# Patient Record
Sex: Male | Born: 1955
Health system: Southern US, Community
[De-identification: ages and names within clinical notes are randomized; demographics above are authoritative.]

## PROBLEM LIST (undated history)

## (undated) DIAGNOSIS — M19049 Primary osteoarthritis, unspecified hand: Secondary | ICD-10-CM

## (undated) DIAGNOSIS — R7302 Impaired glucose tolerance (oral): Secondary | ICD-10-CM

## (undated) DIAGNOSIS — E119 Type 2 diabetes mellitus without complications: Secondary | ICD-10-CM

## (undated) DIAGNOSIS — I10 Essential (primary) hypertension: Secondary | ICD-10-CM

## (undated) DIAGNOSIS — R7303 Prediabetes: Secondary | ICD-10-CM

## (undated) DIAGNOSIS — K219 Gastro-esophageal reflux disease without esophagitis: Secondary | ICD-10-CM

## (undated) DIAGNOSIS — K5900 Constipation, unspecified: Secondary | ICD-10-CM

## (undated) DIAGNOSIS — T7840XA Allergy, unspecified, initial encounter: Secondary | ICD-10-CM

## (undated) DIAGNOSIS — E785 Hyperlipidemia, unspecified: Secondary | ICD-10-CM

## (undated) DIAGNOSIS — J309 Allergic rhinitis, unspecified: Secondary | ICD-10-CM

## (undated) HISTORY — PX: CYST EXCISION: SHX5701

## (undated) HISTORY — DX: Constipation, unspecified: K59.00

## (undated) HISTORY — DX: Allergic rhinitis, unspecified: J30.9

## (undated) HISTORY — PX: COLONOSCOPY: SHX174

## (undated) HISTORY — PX: EYE SURGERY: SHX253

## (undated) HISTORY — DX: Primary osteoarthritis, unspecified hand: M19.049

## (undated) HISTORY — DX: Impaired glucose tolerance (oral): R73.02

## (undated) HISTORY — DX: Hyperlipidemia, unspecified: E78.5

## (undated) HISTORY — DX: Gastro-esophageal reflux disease without esophagitis: K21.9

## (undated) HISTORY — DX: Allergy, unspecified, initial encounter: T78.40XA

## (undated) HISTORY — PX: POLYPECTOMY: SHX149

## (undated) HISTORY — PX: CATARACT EXTRACTION, BILATERAL: SHX1313

---

## 1993-03-24 HISTORY — PX: INGUINAL HERNIA REPAIR: SUR1180

## 2000-05-02 ENCOUNTER — Emergency Department (HOSPITAL_COMMUNITY): Admission: EM | Admit: 2000-05-02 | Discharge: 2000-05-02 | Payer: Self-pay | Admitting: Emergency Medicine

## 2000-05-03 ENCOUNTER — Encounter: Payer: Self-pay | Admitting: Emergency Medicine

## 2000-05-07 ENCOUNTER — Emergency Department (HOSPITAL_COMMUNITY): Admission: EM | Admit: 2000-05-07 | Discharge: 2000-05-07 | Payer: Self-pay | Admitting: Emergency Medicine

## 2000-07-08 ENCOUNTER — Encounter: Admission: RE | Admit: 2000-07-08 | Discharge: 2000-07-08 | Payer: Self-pay | Admitting: Internal Medicine

## 2000-07-08 ENCOUNTER — Encounter: Payer: Self-pay | Admitting: Internal Medicine

## 2000-07-10 ENCOUNTER — Encounter: Payer: Self-pay | Admitting: Internal Medicine

## 2000-07-10 ENCOUNTER — Encounter: Admission: RE | Admit: 2000-07-10 | Discharge: 2000-07-10 | Payer: Self-pay | Admitting: Internal Medicine

## 2000-07-20 ENCOUNTER — Ambulatory Visit (HOSPITAL_COMMUNITY): Admission: RE | Admit: 2000-07-20 | Discharge: 2000-07-20 | Payer: Self-pay | Admitting: Gastroenterology

## 2002-05-27 ENCOUNTER — Encounter: Admission: RE | Admit: 2002-05-27 | Discharge: 2002-05-27 | Payer: Self-pay | Admitting: Internal Medicine

## 2002-05-27 ENCOUNTER — Encounter: Payer: Self-pay | Admitting: Internal Medicine

## 2004-06-17 ENCOUNTER — Ambulatory Visit: Payer: Self-pay | Admitting: Internal Medicine

## 2005-12-05 ENCOUNTER — Ambulatory Visit: Payer: Self-pay | Admitting: Internal Medicine

## 2006-02-05 ENCOUNTER — Ambulatory Visit: Payer: Self-pay | Admitting: Internal Medicine

## 2006-10-19 ENCOUNTER — Ambulatory Visit: Payer: Self-pay | Admitting: Internal Medicine

## 2006-10-19 LAB — CONVERTED CEMR LAB
ALT: 44 units/L (ref 0–53)
AST: 29 units/L (ref 0–37)
Albumin: 4 g/dL (ref 3.5–5.2)
Alkaline Phosphatase: 36 units/L — ABNORMAL LOW (ref 39–117)
BUN: 10 mg/dL (ref 6–23)
Basophils Absolute: 0 10*3/uL (ref 0.0–0.1)
Basophils Relative: 0.5 % (ref 0.0–1.0)
Bilirubin Urine: NEGATIVE
Bilirubin, Direct: 0.1 mg/dL (ref 0.0–0.3)
CO2: 33 meq/L — ABNORMAL HIGH (ref 19–32)
Calcium: 9.1 mg/dL (ref 8.4–10.5)
Chloride: 105 meq/L (ref 96–112)
Cholesterol: 222 mg/dL (ref 0–200)
Creatinine, Ser: 1.2 mg/dL (ref 0.4–1.5)
Direct LDL: 144.7 mg/dL
Eosinophils Absolute: 0.1 10*3/uL (ref 0.0–0.6)
Eosinophils Relative: 2 % (ref 0.0–5.0)
GFR calc Af Amer: 81 mL/min
GFR calc non Af Amer: 67 mL/min
Glucose, Bld: 112 mg/dL — ABNORMAL HIGH (ref 70–99)
HCT: 41.6 % (ref 39.0–52.0)
HDL: 33.8 mg/dL — ABNORMAL LOW (ref >39.0)
Hemoglobin, Urine: NEGATIVE
Hemoglobin: 14.1 g/dL (ref 13.0–17.0)
Ketones, ur: NEGATIVE mg/dL
Leukocytes, UA: NEGATIVE
Lymphocytes Relative: 36.8 % (ref 12.0–46.0)
MCHC: 33.9 g/dL (ref 30.0–36.0)
MCV: 89.5 fL (ref 78.0–100.0)
Monocytes Absolute: 0.4 10*3/uL (ref 0.2–0.7)
Monocytes Relative: 7 % (ref 3.0–11.0)
Neutro Abs: 3.3 10*3/uL (ref 1.4–7.7)
Neutrophils Relative %: 53.7 % (ref 43.0–77.0)
Nitrite: NEGATIVE
Platelets: 211 10*3/uL (ref 150–400)
Potassium: 4.2 meq/L (ref 3.5–5.1)
RBC: 4.65 M/uL (ref 4.22–5.81)
RDW: 12.6 % (ref 11.5–14.6)
Sodium: 144 meq/L (ref 135–145)
Specific Gravity, Urine: 1.025 (ref 1.000–1.03)
TSH: 1.43 microintl units/mL (ref 0.35–5.50)
Total Bilirubin: 1.6 mg/dL — ABNORMAL HIGH (ref 0.3–1.2)
Total CHOL/HDL Ratio: 6.6
Total Protein, Urine: NEGATIVE mg/dL
Total Protein: 6.8 g/dL (ref 6.0–8.3)
Triglycerides: 135 mg/dL (ref 0–149)
Urine Glucose: NEGATIVE mg/dL
Urobilinogen, UA: 0.2 (ref 0.0–1.0)
VLDL: 27 mg/dL (ref 0–40)
WBC: 6 10*3/uL (ref 4.5–10.5)
pH: 6.5 (ref 5.0–8.0)

## 2006-10-21 DIAGNOSIS — Z87898 Personal history of other specified conditions: Secondary | ICD-10-CM | POA: Insufficient documentation

## 2006-10-21 DIAGNOSIS — A630 Anogenital (venereal) warts: Secondary | ICD-10-CM | POA: Insufficient documentation

## 2006-10-21 DIAGNOSIS — J309 Allergic rhinitis, unspecified: Secondary | ICD-10-CM | POA: Insufficient documentation

## 2006-11-30 ENCOUNTER — Encounter: Payer: Self-pay | Admitting: Internal Medicine

## 2007-04-21 ENCOUNTER — Encounter: Payer: Self-pay | Admitting: Internal Medicine

## 2007-09-13 ENCOUNTER — Ambulatory Visit: Payer: Self-pay | Admitting: Internal Medicine

## 2007-09-13 DIAGNOSIS — E785 Hyperlipidemia, unspecified: Secondary | ICD-10-CM | POA: Insufficient documentation

## 2007-09-13 DIAGNOSIS — E739 Lactose intolerance, unspecified: Secondary | ICD-10-CM | POA: Insufficient documentation

## 2007-09-15 ENCOUNTER — Telehealth (INDEPENDENT_AMBULATORY_CARE_PROVIDER_SITE_OTHER): Payer: Self-pay | Admitting: *Deleted

## 2007-09-23 ENCOUNTER — Encounter: Payer: Self-pay | Admitting: Internal Medicine

## 2007-09-27 ENCOUNTER — Telehealth (INDEPENDENT_AMBULATORY_CARE_PROVIDER_SITE_OTHER): Payer: Self-pay | Admitting: *Deleted

## 2008-03-05 ENCOUNTER — Emergency Department (HOSPITAL_COMMUNITY): Admission: EM | Admit: 2008-03-05 | Discharge: 2008-03-05 | Payer: Self-pay | Admitting: Emergency Medicine

## 2009-01-03 ENCOUNTER — Encounter: Payer: Self-pay | Admitting: Internal Medicine

## 2009-05-11 ENCOUNTER — Ambulatory Visit: Payer: Self-pay | Admitting: Internal Medicine

## 2009-05-11 DIAGNOSIS — H1045 Other chronic allergic conjunctivitis: Secondary | ICD-10-CM | POA: Insufficient documentation

## 2009-07-02 ENCOUNTER — Encounter: Payer: Self-pay | Admitting: Internal Medicine

## 2010-03-14 ENCOUNTER — Telehealth: Payer: Self-pay | Admitting: Internal Medicine

## 2010-03-19 ENCOUNTER — Telehealth: Payer: Self-pay | Admitting: Internal Medicine

## 2010-03-19 ENCOUNTER — Encounter (INDEPENDENT_AMBULATORY_CARE_PROVIDER_SITE_OTHER): Payer: Self-pay | Admitting: *Deleted

## 2010-04-21 LAB — CONVERTED CEMR LAB
AST: 42 units/L — ABNORMAL HIGH (ref 0–37)
AST: 46 units/L — ABNORMAL HIGH (ref 0–37)
Albumin: 4.6 g/dL (ref 3.5–5.2)
Alkaline Phosphatase: 39 units/L (ref 39–117)
Basophils Absolute: 0 10*3/uL (ref 0.0–0.1)
Basophils Relative: 0.5 % (ref 0.0–1.0)
Basophils Relative: 0.6 % (ref 0.0–3.0)
Bilirubin Urine: NEGATIVE
Bilirubin, Direct: 0.2 mg/dL (ref 0.0–0.3)
Calcium: 9.1 mg/dL (ref 8.4–10.5)
Calcium: 9.6 mg/dL (ref 8.4–10.5)
Chloride: 102 meq/L (ref 96–112)
Cholesterol: 250 mg/dL (ref 0–200)
Creatinine, Ser: 1.1 mg/dL (ref 0.4–1.5)
Direct LDL: 171.6 mg/dL
Eosinophils Absolute: 0.1 10*3/uL (ref 0.0–0.7)
GFR calc Af Amer: 90 mL/min
GFR calc non Af Amer: 74 mL/min
GFR calc non Af Amer: 80.59 mL/min (ref >60)
HDL: 43.8 mg/dL (ref >39.0)
Hemoglobin, Urine: NEGATIVE
Hemoglobin: 14.2 g/dL (ref 13.0–17.0)
Hgb A1c MFr Bld: 6.2 % — ABNORMAL HIGH (ref 4.6–6.0)
Ketones, ur: NEGATIVE mg/dL
Ketones, ur: NEGATIVE mg/dL
Leukocytes, UA: NEGATIVE
Lymphocytes Relative: 28.5 % (ref 12.0–46.0)
MCHC: 34.9 g/dL (ref 30.0–36.0)
MCV: 88.2 fL (ref 78.0–100.0)
Monocytes Relative: 7.2 % (ref 3.0–12.0)
Neutro Abs: 5 10*3/uL (ref 1.4–7.7)
Neutrophils Relative %: 50.2 % (ref 43.0–77.0)
Neutrophils Relative %: 62.7 % (ref 43.0–77.0)
Nitrite: NEGATIVE
PSA: 0.33 ng/mL (ref 0.10–4.00)
PSA: 0.41 ng/mL (ref 0.10–4.00)
Platelets: 193 10*3/uL (ref 150–400)
RBC: 4.63 M/uL (ref 4.22–5.81)
RBC: 4.91 M/uL (ref 4.22–5.81)
Sodium: 144 meq/L (ref 135–145)
Specific Gravity, Urine: 1.015 (ref 1.000–1.030)
TSH: 1.4 microintl units/mL (ref 0.35–5.50)
TSH: 1.64 microintl units/mL (ref 0.35–5.50)
Total Bilirubin: 1.6 mg/dL — ABNORMAL HIGH (ref 0.3–1.2)
Total Protein: 7.8 g/dL (ref 6.0–8.3)
Triglycerides: 144 mg/dL (ref 0–149)
Urine Glucose: NEGATIVE mg/dL
Urobilinogen, UA: 0.2 (ref 0.0–1.0)
Urobilinogen, UA: 0.2 (ref 0.0–1.0)
VLDL: 20.2 mg/dL (ref 0.0–40.0)
VLDL: 29 mg/dL (ref 0–40)
WBC: 8 10*3/uL (ref 4.5–10.5)
pH: 7.5 (ref 5.0–8.0)

## 2010-04-23 NOTE — Assessment & Plan Note (Signed)
Summary: CPX / NWS  #   Vital Signs:  Patient profile:   55 year old male Height:      70 inches Weight:      192.25 pounds BMI:     27.68 O2 Sat:      97 % on Room air Temp:     98.3 degrees F oral Pulse rate:   79 / minute BP sitting:   138 / 90  (left arm)  Vitals Entered By: Lucious Groves (May 11, 2009 11:15 AM)  O2 Flow:  Room air  CC: CPX--Oc feels weak or faint, but denies passing out./kb Is Patient Diabetic? No Pain Assessment Patient in pain? no        CC:  CPX--Oc feels weak or faint and but denies passing out./kb.  History of Present Illness: has physical job, looking for new job b/c has to travel as well; has vague episodes of general weakness once monthly it seems but not incapacitating, mostly annoying but keeps happening;  Pt denies CP, sob, doe, wheezing, orthopnea, pnd, worsening LE edema, palps, dizziness or syncope   Pt denies new neuro symptoms such as headache, facial or extremity weakness   No fever, wt loss, rash, joint swelling, increased stress or depressive symptoms, or falls or trauma.    Problems Prior to Update: 1)  Preventive Health Care  (ICD-V70.0) 2)  Conjunctivitis, Allergic  (ICD-372.14) 3)  Glucose Intolerance  (ICD-271.3) 4)  Hyperlipidemia  (ICD-272.4) 5)  Preventive Health Care  (ICD-V70.0) 6)  Venereal Wart  (ICD-078.11) 7)  Genital Herpes, Hx of  (ICD-V13.8) 8)  Allergic Rhinitis  (ICD-477.9)  Medications Prior to Update: 1)  Prevacid 30 Mg  Cpdr (Lansoprazole) .Marland Kitchen.. 1 Po Once Daily 2)  Adult Aspirin Ec Low Strength 81 Mg  Tbec (Aspirin) .Marland Kitchen.. 1 By Mouth Once Daily 3)  Cetirizine Hcl 10 Mg  Tabs (Cetirizine Hcl) .Marland Kitchen.. 1 By Mouth Once Daily As Needed Allergies 4)  Simvastatin 40 Mg  Tabs (Simvastatin) .... Take 1 Tablet By Mouth Once A Day 5)  Valtrex 1 Gm  Tabs (Valacyclovir Hcl) .... Take 1 Tablet By Mouth Once A Day  Current Medications (verified): 1)  Prevacid 30 Mg  Cpdr (Lansoprazole) .Marland Kitchen.. 1 Po Once Daily - Generic 2)   Adult Aspirin Ec Low Strength 81 Mg  Tbec (Aspirin) .Marland Kitchen.. 1 By Mouth Once Daily 3)  Fexofenadine Hcl 180 Mg Tabs (Fexofenadine Hcl) .Marland Kitchen.. 1po Once Daily 4)  Simvastatin 40 Mg  Tabs (Simvastatin) .... Take 1 Tablet By Mouth Once A Day 5)  Valtrex 1 Gm  Tabs (Valacyclovir Hcl) .... 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  all otherwise negative per pt -  does have some eye allergy symptoms worse in the past 2 to 3 months with itching and clear d/c  Physical Exam  General:  alert and overweight-appearing.   Head:  normocephalic and atraumatic.   Eyes:  vision grossly intact, pupils equal, and pupils round.   Ears:  R ear normal and L ear normal.   Nose:  no external deformity and no nasal discharge.   Mouth:  no gingival abnormalities and pharynx pink and moist.   Neck:  supple and no masses.   Lungs:  normal  respiratory effort and normal breath sounds.   Heart:  normal rate and regular rhythm.   Abdomen:  soft, non-tender, and normal bowel sounds.   Msk:  no joint tenderness and no joint swelling.   Extremities:  no edema, no erythema  Neurologic:  cranial nerves II-XII intact and strength normal in all extremities.   Skin:  color normal and no rashes.   Psych:  not anxious appearing and not depressed appearing.     Impression & Recommendations:  Problem # 1:  Preventive Health Care (ICD-V70.0) Overall doing well, age appropriate education and counseling updated and referral for appropriate preventive services done unless declined, immunizations up to date or declined, diet counseling done if overweight, urged to quit smoking if smokes , most recent labs reviewed and current ordered if appropriate, ecg reviewed or declined (interpretation per ECG scanned in the EMR if done); information regarding Medicare Prevention requirements given if appropriate  Orders: EKG w/ Interpretation (93000) Gastroenterology Referral (GI) TLB-BMP (Basic Metabolic Panel-BMET) (80048-METABOL) TLB-CBC Platelet - w/Differential (85025-CBCD) TLB-Hepatic/Liver Function Pnl (80076-HEPATIC) TLB-Lipid Panel (80061-LIPID) TLB-PSA (Prostate Specific Antigen) (84153-PSA) TLB-TSH (Thyroid Stimulating Hormone) (84443-TSH) TLB-Udip ONLY (81003-UDIP)  Problem # 2:  CONJUNCTIVITIS, ALLERGIC (ICD-372.14) allegra, zaditor  Complete Medication List: 1)  Prevacid 30 Mg Cpdr (Lansoprazole) .Marland Kitchen.. 1 po once daily - generic 2)  Adult Aspirin Ec Low Strength 81 Mg Tbec (Aspirin) .Marland Kitchen.. 1 by mouth once daily 3)  Fexofenadine Hcl 180 Mg Tabs (Fexofenadine hcl) .Marland Kitchen.. 1po once daily 4)  Simvastatin 40 Mg Tabs (Simvastatin) .... Take 1 tablet by mouth once a day 5)  Valtrex 1 Gm Tabs (Valacyclovir hcl) .... Take 1 tablet by mouth once a day  Other Orders: Tdap => 92yrs IM ([address]) Admin 1st Vaccine (60454)  Patient Instructions: 1)  you  had the tetanus shot today 2)  Please take all new medications as prescribed - the generic for allegra for the allergies 3)  Please also try the OTC Zaditor (eye drops for allergies) 4)  Please go to the Lab in the basement for your blood and/or urine tests today 5)  You will be contacted about the referral(s) to: colonoscopy for 6 months 6)  you are given the refills today 7)  Please schedule a follow-up appointment in 1 year or sooner if needed Prescriptions: VALTREX 1 GM  TABS (VALACYCLOVIR HCL) Take 1 tablet by mouth once a day  #30 x 11   Entered and Authorized by:   Corwin Levins MD   Signed by:   Corwin Levins MD on 05/11/2009   Method used:   Print then Give to Patient   RxID:   [id number] SIMVASTATIN 40 MG  TABS (SIMVASTATIN) Take 1 tablet by mouth once a day  #90 x 3   Entered and Authorized by:   Corwin Levins MD   Signed by:   Corwin Levins MD on 05/11/2009  Method used:   Print then Give to Patient   RxID:   [id number] FEXOFENADINE HCL 180 MG TABS (FEXOFENADINE HCL) 1po once daily  #90 x 3   Entered and Authorized by:   Corwin Levins MD   Signed by:   Corwin Levins MD on 05/11/2009   Method used:   Print then Give to Patient   RxID:   [id number] PREVACID 30 MG  CPDR (LANSOPRAZOLE) 1 po once daily - generic  #90 x 3   Entered and Authorized by:   Corwin Levins MD   Signed by:   Corwin Levins MD on 05/11/2009   Method used:   Print then Give to Patient   RxID:   [id number]    Prevention & Chronic Care Immunizations   Influenza vaccine: Fluvax 3+  (02/05/2006)    Tetanus booster: 05/11/2009: Tdap    Pneumococcal vaccine: Not documented  Colorectal Screening   Hemoccult: Not documented    Colonoscopy: Not documented  Other Screening   PSA: 0.33  (09/13/2007)   PSA ordered.   Smoking status: never  (09/13/2007)  Lipids   Total Cholesterol: 250  (09/13/2007)   LDL: DEL  (09/13/2007)   LDL Direct: 171.6  (09/13/2007)   HDL: 43.8   (09/13/2007)   Triglycerides: 144  (09/13/2007)    SGOT (AST): 46  (09/13/2007)   SGPT (ALT): 75  (09/13/2007)   Alkaline phosphatase: 51  (09/13/2007)   Total bilirubin: 1.6  (09/13/2007)  Self-Management Support :    Lipid self-management support: Not documented     Immunizations Administered:  Tetanus Vaccine:    Vaccine Type: Tdap    Site: left deltoid    Mfr: GlaxoSmithKline    Dose: 0.5 ml    Route: IM    Given by: Lucious Groves    Exp. Date: 05/19/2011    Lot #: [id number]    VIS given: 02/09/07 version given May 11, 2009.

## 2010-04-23 NOTE — Letter (Signed)
Summary: Referral - not able to see patient  [facility]  [address] [place], [place] [address]   Phone: [phone]  Fax: [phone]    July 02, 2009   Oliver Barre, M.D. [address] [place], [place] [address]   Re:   Lance [name] DOB:  10-09-1955 MRN:   [id number]    Dear Dr. Jonny [name]:  Thank you for your kind referral of the above patient.  We have attempted to schedule the recommended procedure Screening Colonoscopy but have not been able to schedule because:   X  The patient was not available by phone and/or has not returned our calls.  ___ The patient declined to schedule the procedure at this time.  We appreciate the referral and hope that we will have the opportunity to treat this patient in the future.    Sincerely,    [facility] [phone]

## 2010-04-25 NOTE — Letter (Signed)
Summary: Out of Work  [facility]  [address] [place], [place] [address]   Phone: [phone]  Fax: [phone]    March 19, 2010   Employee:  LINCOLN GINLEY    To Whom It May Concern:   For Medical reasons, please excuse the above named employee from work for the following dates:  Start:   03/18/2010  End:   03/20/2010  If you need additional information, please feel free to contact our office.         Sincerely,    Dr. Oliver Barre

## 2010-04-25 NOTE — Letter (Signed)
Summary: Out of Work  [facility]  [address] [place], [place] [address]   Phone: [phone]  Fax: [phone]    March 19, 2010   Employee:  TRELLIS VANOVERBEKE    To Whom It May Concern:   For Medical reasons, please excuse the above named employee from work for the following dates:  Start:   Monday December 26th 2011  End:   Wednesday December 28th 2011 - To return Thursday December 29th 2011  If you need additional information, please feel free to contact our office.         Sincerely,     Margaret Pyle, CMA    Corwin Levins MD  Appended Document: Out of Work Note in cabinet for pt pick up

## 2010-04-25 NOTE — Progress Notes (Signed)
Summary: Diarrhea  Phone Note Call from Patient   Caller: Patient [phone] VM OK Summary of Call: Pt called stating he has been experiencing diarrhea all night without fever or pain. Pt is requesting MD advisement of OTC meds or Rx that would be safe to take with current meds.  Initial call taken by: Margaret Pyle, CMA,  March 14, 2010 9:15 AM  Follow-up for Phone Call        immodium Ok at OTC recommended dosing;  also might try metamucil as fiber can often lead to firm stools as well  pt should consider OV for fever, n/v, pain, blood or dizziness Follow-up by: Corwin Levins MD,  March 14, 2010 11:02 AM  Additional Follow-up for Phone Call Additional follow up Details #1::        Pt advised in detail via VM,, told to call with any further questions or concerns Additional Follow-up by: Margaret Pyle, CMA,  March 14, 2010 11:33 AM

## 2010-04-25 NOTE — Progress Notes (Signed)
  Phone Note Call from Patient Call back at Home Phone [phone]   Caller: Patient--[phone] cell Call For: Corwin Levins MD Summary of Call: Pt sick all weekend w/diarrhea, needs to return to work but needs work note. Can pt get work note or will he need o.v.? Initial call taken by: Verdell Face,  March 19, 2010 8:18 AM  Follow-up for Phone Call        is he getting better overall?  please check on frequency, severity, and whether pain, fever, or blood; or even dizziness and weakness; if not improved will need OV  if improved and these symptoms not occurring, ok for work note Follow-up by: Corwin Levins MD,  March 19, 2010 12:55 PM  Additional Follow-up for Phone Call Additional follow up Details #1::        Per pt he is improving but slowly, note through Wednesday and pt agreed that if he is still not well enough for work he will schedule OV for eval with Dr Jonny [name]. Additional Follow-up by: Margaret Pyle, CMA,  March 19, 2010 2:02 PM    Additional Follow-up for Phone Call Additional follow up Details #2::    ok for note - to robin to handle Follow-up by: Corwin Levins MD,  March 19, 2010 3:24 PM  Additional Follow-up for Phone Call Additional follow up Details #3:: Details for Additional Follow-up Action Taken: ok note completed Additional Follow-up by: Robin Ewing CMA Duncan Dull),  March 19, 2010 3:48 PM

## 2010-05-07 ENCOUNTER — Other Ambulatory Visit: Payer: Self-pay

## 2010-05-13 ENCOUNTER — Other Ambulatory Visit: Payer: No Typology Code available for payment source

## 2010-05-13 ENCOUNTER — Other Ambulatory Visit: Payer: Self-pay | Admitting: Internal Medicine

## 2010-05-13 ENCOUNTER — Encounter (INDEPENDENT_AMBULATORY_CARE_PROVIDER_SITE_OTHER): Payer: Self-pay | Admitting: *Deleted

## 2010-05-13 DIAGNOSIS — Z0000 Encounter for general adult medical examination without abnormal findings: Secondary | ICD-10-CM

## 2010-05-13 LAB — LIPID PANEL
Cholesterol: 140 mg/dL (ref 0–200)
HDL: 56.5 mg/dL (ref >39.00)
LDL Cholesterol: 75 mg/dL (ref 0–99)
Triglycerides: 45 mg/dL (ref 0.0–149.0)
VLDL: 9 mg/dL (ref 0.0–40.0)

## 2010-05-13 LAB — CBC WITH DIFFERENTIAL/PLATELET
Basophils Absolute: 0 10*3/uL (ref 0.0–0.1)
Basophils Relative: 0.3 % (ref 0.0–3.0)
Eosinophils Absolute: 0.1 10*3/uL (ref 0.0–0.7)
Lymphocytes Relative: 30.6 % (ref 12.0–46.0)
MCHC: 33.9 g/dL (ref 30.0–36.0)
MCV: 90.2 fl (ref 78.0–100.0)
Monocytes Absolute: 0.4 10*3/uL (ref 0.1–1.0)
Neutrophils Relative %: 62.4 % (ref 43.0–77.0)
Platelets: 209 10*3/uL (ref 150.0–400.0)
RBC: 4.61 Mil/uL (ref 4.22–5.81)

## 2010-05-13 LAB — URINALYSIS
Bilirubin Urine: NEGATIVE
Hgb urine dipstick: NEGATIVE
Total Protein, Urine: NEGATIVE
Urine Glucose: NEGATIVE

## 2010-05-13 LAB — BASIC METABOLIC PANEL
Chloride: 106 mEq/L (ref 96–112)
GFR: 96.86 mL/min (ref >60.00)
Potassium: 4.6 mEq/L (ref 3.5–5.1)
Sodium: 142 mEq/L (ref 135–145)

## 2010-05-13 LAB — PSA: PSA: 0.45 ng/mL (ref 0.10–4.00)

## 2010-05-13 LAB — HEPATIC FUNCTION PANEL
ALT: 53 U/L (ref 0–53)
Albumin: 4.6 g/dL (ref 3.5–5.2)
Bilirubin, Direct: 0.3 mg/dL (ref 0.0–0.3)
Total Protein: 7.3 g/dL (ref 6.0–8.3)

## 2010-05-14 ENCOUNTER — Encounter: Payer: Self-pay | Admitting: Internal Medicine

## 2010-05-14 ENCOUNTER — Encounter (INDEPENDENT_AMBULATORY_CARE_PROVIDER_SITE_OTHER): Payer: No Typology Code available for payment source | Admitting: Internal Medicine

## 2010-05-14 DIAGNOSIS — Z0000 Encounter for general adult medical examination without abnormal findings: Secondary | ICD-10-CM

## 2010-05-14 DIAGNOSIS — M19049 Primary osteoarthritis, unspecified hand: Secondary | ICD-10-CM | POA: Insufficient documentation

## 2010-05-14 DIAGNOSIS — L989 Disorder of the skin and subcutaneous tissue, unspecified: Secondary | ICD-10-CM | POA: Insufficient documentation

## 2010-05-21 NOTE — Assessment & Plan Note (Signed)
Summary: CPX-LB   Vital Signs:  Patient profile:   55 year old male Height:      70.5 inches Weight:      199.13 pounds BMI:     28.27 O2 Sat:      97 % on Room air Temp:     99.3 degrees F oral Pulse rate:   69 / minute BP sitting:   132 / 90  (left arm) Cuff size:   large  Vitals Entered By: Zella Ball Ewing CMA (AAMA) (May 14, 2010 2:20 PM)  O2 Flow:  Room air  CC: Adult Physical/RE   CC:  Adult Physical/RE.  History of Present Illness: here for wellness and f/u;  Pt denies CP, worsening sob, doe, wheezing, orthopnea, pnd, worsening LE edema, palps, dizziness or syncope  Pt denies new neuro symptoms such as headache, facial or extremity weakness  Pt denies polydipsia, polyuria,  Overall good compliance with meds, trying to follow low chol diet, wt stable, little excercise however  No fever, wt loss, night sweats, loss of appetite or other constitutional symptoms  Overall good compliance with meds, and good tolerability.  Denies worsening depressive symptoms, suicidal ideation, or panic.  Pt states good ability with ADL's, low fall risk, home safety reviewed and adequate, no significant change in hearing or vision, trying to follow lower chol diet, and occasionally active only with regular excercise.  Works very physical job.   Preventive Screening-Counseling & Management      Drug Use:  no.    Problems Prior to Update: 1)  Preventive Health Care  (ICD-V70.0) 2)  Conjunctivitis, Allergic  (ICD-372.14) 3)  Glucose Intolerance  (ICD-271.3) 4)  Hyperlipidemia  (ICD-272.4) 5)  Preventive Health Care  (ICD-V70.0) 6)  Venereal Wart  (ICD-078.11) 7)  Genital Herpes, Hx of  (ICD-V13.8) 8)  Allergic Rhinitis  (ICD-477.9)  Medications Prior to Update: 1)  Prevacid 30 Mg  Cpdr (Lansoprazole) .Marland Kitchen.. 1 Po Once Daily - Generic 2)  Adult Aspirin Ec Low Strength 81 Mg  Tbec (Aspirin) .Marland Kitchen.. 1 By Mouth Once Daily 3)  Fexofenadine Hcl 180 Mg Tabs (Fexofenadine Hcl) .Marland Kitchen.. 1po Once Daily 4)   Simvastatin 40 Mg  Tabs (Simvastatin) .... Take 1 Tablet By Mouth Once A Day 5)  Valtrex 1 Gm  Tabs (Valacyclovir Hcl) .... Take 1 Tablet By Mouth Once A Day  Current Medications (verified): 1)  Prevacid 30 Mg  Cpdr (Lansoprazole) .Marland Kitchen.. 1 Po Once Daily - Generic 2)  Adult Aspirin Ec Low Strength 81 Mg  Tbec (Aspirin) .Marland Kitchen.. 1 By Mouth Once Daily 3)  Fexofenadine Hcl 180 Mg Tabs (Fexofenadine Hcl) .Marland Kitchen.. 1po Once Daily 4)  Simvastatin 40 Mg  Tabs (Simvastatin) .... Take 1 Tablet By Mouth Once A Day 5)  Valtrex 1 Gm  Tabs (Valacyclovir Hcl) .... 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   Head:  normocephalic and atraumatic.   Eyes:  vision grossly intact, pupils equal, and pupils round.   Ears:  R ear normal and L ear normal.   Nose:  no external deformity and no nasal discharge.   Mouth:  no gingival abnormalities and pharynx pink and moist.   Neck:  supple and no masses.   Lungs:  normal respiratory effort and normal breath sounds.   Heart:  normal rate and regular rhythm.   Abdomen:  soft, non-tender, and normal bowel sounds.    Msk:  no joint tenderness and no joint swelling.   Extremities:  no edema, no erythema  Neurologic:  cranial nerves II-XII intact and strength normal in all extremities.   Skin:  color normal and no rashes.  but still with several veneral warts  Psych:  not anxious appearing and not depressed appearing.     Impression & Recommendations:  Problem # 1:  Preventive Health Care (ICD-V70.0) Assessment Unchanged  Overall doing well, age appropriate education and counseling updated, referral for preventive services and immunizations addressed, dietary counseling and smoking status adressed , most recent labs reviewed, ecg reviewed I have personally reviewed and have noted 1.The patient's medical and social history 2.Their use of alcohol, tobacco or illicit drugs 3.Their current medications and supplements 4. Functional ability including ADL's, fall risk, home safety risk, hearing & visual impairment  5.Diet and physical activities 6.Evidence for depression or mood disorders The patients weight, height, BMI  have been recorded in the chart I have made referrals, counseling and provided education to the patient based review of the above  Orders: EKG w/ Interpretation (93000) Gastroenterology Referral (GI)  Td Booster: Tdap (05/11/2009)   Flu Vax: Fluvax 3+ (02/05/2006)   Chol: 140 (05/13/2010)   HDL: 56.50 (05/13/2010)   LDL: 75 (05/13/2010)   TG: 45.0 (05/13/2010) TSH: 0.99 (05/13/2010)   HgbA1C: 6.2 (09/13/2007)   PSA: 0.45 (05/13/2010)  Problem # 2:  OSTEOARTHRITIS, HANDS, BILATERAL (ICD-715.94)  His updated medication list for this problem includes:    Adult Aspirin Ec Low Strength 81 Mg Tbec (Aspirin) .Marland Kitchen... 1 by mouth once daily for tylenol arthritis as needed   Problem # 3:  SKIN LESION (ICD-709.9) aldara prior no help - to f/u with derm Orders: Dermatology Referral (Derma)  Problem # 4:  HYPERLIPIDEMIA (ICD-272.4)  His updated medication list for this problem includes:     Simvastatin 40 Mg Tabs (Simvastatin) .Marland Kitchen... Take 1 tablet by mouth once a day  Labs Reviewed: SGOT: 49 (05/13/2010)   SGPT: 53 (05/13/2010)   HDL:56.50 (05/13/2010), 59.80 (05/11/2009)  LDL:75 (05/13/2010), DEL (16/12/9602)  Chol:140 (05/13/2010), 232 (05/11/2009)  Trig:45.0 (05/13/2010), 101.0 (05/11/2009) marked improvement - Continue all previous medications as before this visit , Pt to continue diet efforts, good med tolerance; to check labs next visit - goal LDL less than 100  Complete Medication List: 1)  Prevacid 30 Mg Cpdr (Lansoprazole) .Marland Kitchen.. 1 po once daily - generic 2)  Adult Aspirin Ec Low Strength 81 Mg Tbec (Aspirin) .Marland Kitchen.. 1 by mouth once daily 3)  Fexofenadine Hcl 180 Mg Tabs (Fexofenadine hcl) .Marland Kitchen.. 1po once daily 4)  Simvastatin 40 Mg Tabs (Simvastatin) .... Take 1 tablet by mouth once a day 5)  Valtrex 1 Gm Tabs (Valacyclovir hcl) .... Take 1 tablet by mouth once a day  Patient Instructions: 1)  Please take all new  medications as recommended  - the tylenol arthritis (or the generic off brand) 2)  Continue all previous medications as before this visit  3)  You will be contacted about the referral(s) to: colonoscopy, and dermatology 4)  Please schedule a follow-up appointment in 1 year or sooner if needed Prescriptions: VALTREX 1 GM  TABS (VALACYCLOVIR HCL) Take 1 tablet by mouth once a day  #90 x 3   Entered and Authorized by:   Corwin Levins MD   Signed by:   Corwin Levins MD on 05/14/2010   Method used:   Print then Give to Patient   RxID:   [id number] SIMVASTATIN 40 MG  TABS (SIMVASTATIN) Take 1 tablet by mouth once a day  #90 x 3   Entered and Authorized by:   Corwin Levins MD   Signed by:   Corwin Levins MD on 05/14/2010   Method used:   Print then Give to Patient   RxID:   [id number] FEXOFENADINE HCL 180 MG TABS (FEXOFENADINE HCL) 1po once daily  #90 x 3   Entered and Authorized by:   Corwin Levins MD   Signed by:   Corwin Levins MD on 05/14/2010   Method  used:   Print then Give to Patient   RxID:   [id number] PREVACID 30 MG  CPDR (LANSOPRAZOLE) 1 po once daily - generic  #90 x 3   Entered and Authorized by:   Corwin Levins MD   Signed by:   Corwin Levins MD on 05/14/2010   Method used:   Print then Give to Patient   RxID:   [id number]    Orders Added: 1)  EKG w/ Interpretation [93000] 2)  Gastroenterology Referral [GI] 3)  Dermatology Referral [Derma] 4)  Est. Patient 40-64 years [42595]

## 2011-06-16 ENCOUNTER — Telehealth: Payer: Self-pay

## 2011-06-16 DIAGNOSIS — Z0000 Encounter for general adult medical examination without abnormal findings: Secondary | ICD-10-CM

## 2011-06-16 NOTE — Telephone Encounter (Signed)
Put lab order in. 

## 2011-06-20 ENCOUNTER — Other Ambulatory Visit: Payer: Self-pay | Admitting: Internal Medicine

## 2011-06-25 ENCOUNTER — Other Ambulatory Visit: Payer: Self-pay | Admitting: Internal Medicine

## 2011-07-05 ENCOUNTER — Other Ambulatory Visit: Payer: Self-pay | Admitting: Internal Medicine

## 2011-07-08 ENCOUNTER — Other Ambulatory Visit: Payer: Self-pay | Admitting: Internal Medicine

## 2011-07-14 ENCOUNTER — Encounter: Payer: No Typology Code available for payment source | Admitting: Internal Medicine

## 2011-08-16 ENCOUNTER — Encounter: Payer: Self-pay | Admitting: Internal Medicine

## 2011-08-16 DIAGNOSIS — R7302 Impaired glucose tolerance (oral): Secondary | ICD-10-CM | POA: Insufficient documentation

## 2011-08-16 DIAGNOSIS — E119 Type 2 diabetes mellitus without complications: Secondary | ICD-10-CM | POA: Insufficient documentation

## 2011-08-16 DIAGNOSIS — Z0000 Encounter for general adult medical examination without abnormal findings: Secondary | ICD-10-CM | POA: Insufficient documentation

## 2011-08-16 DIAGNOSIS — Z0001 Encounter for general adult medical examination with abnormal findings: Secondary | ICD-10-CM | POA: Insufficient documentation

## 2011-08-29 ENCOUNTER — Encounter: Payer: No Typology Code available for payment source | Admitting: Internal Medicine

## 2011-08-29 DIAGNOSIS — Z0289 Encounter for other administrative examinations: Secondary | ICD-10-CM

## 2011-09-26 ENCOUNTER — Other Ambulatory Visit (INDEPENDENT_AMBULATORY_CARE_PROVIDER_SITE_OTHER): Payer: No Typology Code available for payment source

## 2011-09-26 DIAGNOSIS — Z0000 Encounter for general adult medical examination without abnormal findings: Secondary | ICD-10-CM

## 2011-09-26 LAB — URINALYSIS, ROUTINE W REFLEX MICROSCOPIC
Bilirubin Urine: NEGATIVE
Hgb urine dipstick: NEGATIVE
Ketones, ur: NEGATIVE
Leukocytes, UA: NEGATIVE
Nitrite: NEGATIVE

## 2011-09-26 LAB — HEPATIC FUNCTION PANEL
ALT: 55 U/L — ABNORMAL HIGH (ref 0–53)
AST: 37 U/L (ref 0–37)
Albumin: 4.3 g/dL (ref 3.5–5.2)
Total Bilirubin: 1.7 mg/dL — ABNORMAL HIGH (ref 0.3–1.2)
Total Protein: 7.1 g/dL (ref 6.0–8.3)

## 2011-09-26 LAB — BASIC METABOLIC PANEL
BUN: 14 mg/dL (ref 6–23)
CO2: 31 mEq/L (ref 19–32)
Chloride: 105 mEq/L (ref 96–112)
Creatinine, Ser: 1.2 mg/dL (ref 0.4–1.5)
Glucose, Bld: 108 mg/dL — ABNORMAL HIGH (ref 70–99)

## 2011-09-26 LAB — CBC WITH DIFFERENTIAL/PLATELET
Basophils Relative: 0.3 % (ref 0.0–3.0)
Eosinophils Absolute: 0.2 10*3/uL (ref 0.0–0.7)
Eosinophils Relative: 2.7 % (ref 0.0–5.0)
Hemoglobin: 13.6 g/dL (ref 13.0–17.0)
Lymphocytes Relative: 39.6 % (ref 12.0–46.0)
MCHC: 32.7 g/dL (ref 30.0–36.0)
Neutro Abs: 3.4 10*3/uL (ref 1.4–7.7)
RBC: 4.72 Mil/uL (ref 4.22–5.81)
WBC: 6.7 10*3/uL (ref 4.5–10.5)

## 2011-09-26 LAB — TSH: TSH: 1.43 u[IU]/mL (ref 0.35–5.50)

## 2011-09-26 LAB — PSA: PSA: 0.3 ng/mL (ref 0.10–4.00)

## 2011-09-26 LAB — LIPID PANEL
LDL Cholesterol: 88 mg/dL (ref 0–99)
Total CHOL/HDL Ratio: 3

## 2011-10-10 ENCOUNTER — Ambulatory Visit (INDEPENDENT_AMBULATORY_CARE_PROVIDER_SITE_OTHER): Payer: No Typology Code available for payment source | Admitting: Internal Medicine

## 2011-10-10 ENCOUNTER — Telehealth: Payer: Self-pay | Admitting: Internal Medicine

## 2011-10-10 ENCOUNTER — Encounter: Payer: Self-pay | Admitting: Internal Medicine

## 2011-10-10 VITALS — BP 122/82 | HR 73 | Temp 98.0°F | Ht 70.0 in | Wt 206.1 lb

## 2011-10-10 DIAGNOSIS — Z0000 Encounter for general adult medical examination without abnormal findings: Secondary | ICD-10-CM

## 2011-10-10 DIAGNOSIS — R030 Elevated blood-pressure reading, without diagnosis of hypertension: Secondary | ICD-10-CM

## 2011-10-10 DIAGNOSIS — B079 Viral wart, unspecified: Secondary | ICD-10-CM

## 2011-10-10 MED ORDER — VALACYCLOVIR HCL 1 G PO TABS: 1000.0000 mg | ORAL_TABLET | Freq: Every day | ORAL | Status: DC

## 2011-10-10 MED ORDER — SIMVASTATIN 40 MG PO TABS: 40.0000 mg | ORAL_TABLET | Freq: Every day | ORAL | Status: DC

## 2011-10-10 MED ORDER — OMEPRAZOLE 20 MG PO CPDR: 40.0000 mg | DELAYED_RELEASE_CAPSULE | Freq: Every day | ORAL | Status: DC

## 2011-10-10 MED ORDER — ASPIRIN 81 MG PO TBEC: 81.0000 mg | DELAYED_RELEASE_TABLET | Freq: Every day | ORAL | Status: DC

## 2011-10-10 MED ORDER — LANSOPRAZOLE 30 MG PO CPDR: 30.0000 mg | DELAYED_RELEASE_CAPSULE | Freq: Every day | ORAL | Status: DC

## 2011-10-10 NOTE — Patient Instructions (Addendum)
Continue all other medications as before Your refills were done today You will be contacted regarding the referral for: dermatology, and the colonoscopy You are otherwise up to date with prevention Please continue to monitor your blood pressure on a regular basis;  Your goal is to be < 140/90 Please also start aspirin 81 - 1 per day - Enteric Coated only Please return in 1 year for your yearly visit, or sooner if needed, with Lab testing done 3-5 days before

## 2011-10-10 NOTE — Assessment & Plan Note (Addendum)

## 2011-10-10 NOTE — Telephone Encounter (Signed)
Done erx - prevacid changed to prilosec 20 - 2 qd

## 2011-10-11 ENCOUNTER — Encounter: Payer: Self-pay | Admitting: Internal Medicine

## 2011-10-11 DIAGNOSIS — R030 Elevated blood-pressure reading, without diagnosis of hypertension: Secondary | ICD-10-CM | POA: Insufficient documentation

## 2011-10-11 NOTE — Progress Notes (Signed)
Subjective:    Patient ID: Lance [name], male    DOB: 1955/11/06, 56 y.o.   MRN: [id number]  HPI  Here for wellness and f/u;  Overall doing ok;  Pt denies CP, worsening SOB, DOE, wheezing, orthopnea, PND, worsening LE edema, palpitations, dizziness or syncope.  Pt denies neurological change such as new Headache, facial or extremity weakness.  Pt denies polydipsia, polyuria, or low sugar symptoms. Pt states overall good compliance with treatment and medications, good tolerability, and trying to follow lower cholesterol diet.  Pt denies worsening depressive symptoms, suicidal ideation or panic. No fever, wt loss, night sweats, loss of appetite, or other constitutional symptoms.  Pt states good ability with ADL's, low fall risk, home safety reviewed and adequate, no significant changes in hearing or vision, and occasionally active with exercise.  Did have some sort of chemical exposure to work, but no apparent problems from this.  BP was elevated mild at [employer] several times.  Also with 5 mo recurring left LBP - without change in severity, bowel or bladder change, fever, wt loss,  worsening LE pain/numbness/weakness, gait change or falls.  Asks for derm referral for recurrent warts as well Past Medical History  Diagnosis Date  . Impaired glucose tolerance 08/16/2011  . HYPERLIPIDEMIA 09/13/2007    Qualifier: Diagnosis of  By: Jonny [name] MD, Len Blalock   . CONJUNCTIVITIS, ALLERGIC 05/11/2009    Qualifier: Diagnosis of  By: Jonny [name] MD, Len Blalock   . ALLERGIC RHINITIS 10/21/2006    Qualifier: Diagnosis of  By: Jonny [name] MD, Len Blalock   . GENITAL HERPES, HX OF 10/21/2006    Qualifier: Diagnosis of  By: Jonny [name] MD, Leandro Reasoner, HANDS, BILATERAL 05/14/2010    Qualifier: Diagnosis of  By: Jonny [name] MD, Len Blalock    Past Surgical History  Procedure Date  . Inguinal hernia repair 1995    reports that he has never smoked. He has never used smokeless tobacco. He reports that he does not drink alcohol or use illicit  drugs. family history includes Aneurysm in an unspecified family member; Diabetes in an unspecified family member; Hypertension in an unspecified family member; Hypothyroidism in an unspecified family member; and Prostate cancer in his father. No Known Allergies Current Outpatient Prescriptions on File Prior to Visit  Medication Sig Dispense Refill  . simvastatin (ZOCOR) 40 MG tablet Take 1 tablet (40 mg total) by mouth daily.  90 tablet  3  . omeprazole (PRILOSEC) 20 MG capsule Take 2 capsules (40 mg total) by mouth daily.  180 capsule  3   Review of Systems Review of Systems  Constitutional: Negative for diaphoresis, activity change, appetite change and unexpected weight change.  HENT: Negative for hearing loss, ear pain, facial swelling, mouth sores and neck stiffness.   Eyes: Negative for pain, redness and visual disturbance.  Respiratory: Negative for shortness of breath and wheezing.   Cardiovascular: Negative for chest pain and palpitations.  Gastrointestinal: Negative for diarrhea, blood in stool, abdominal distention and rectal pain.  Genitourinary: Negative for hematuria, flank pain and decreased urine volume.  Musculoskeletal: Negative for myalgias and joint swelling.  Skin: Negative for color change and wound.  Neurological: Negative for syncope and numbness.  Hematological: Negative for adenopathy.  Psychiatric/Behavioral: Negative for hallucinations, self-injury, decreased concentration and agitation.      Objective:   Physical Exam BP 122/82  Pulse 73  Temp 98 F (36.7 C) (Oral)  Ht 5\' 10"  (1.778 m)  Wt 206 lb 2 oz (  93.498 kg)  BMI 29.58 kg/m2  SpO2 97% Physical Exam  VS noted Constitutional: Pt is oriented to person, place, and time. Appears well-developed and well-nourished.  HENT:  Head: Normocephalic and atraumatic.  Right Ear: External ear normal.  Left Ear: External ear normal.  Nose: Nose normal.  Mouth/Throat: Oropharynx is clear and moist.  Eyes:  Conjunctivae and EOM are normal. Pupils are equal, round, and reactive to light.  Neck: Normal range of motion. Neck supple. No JVD present. No tracheal deviation present.  Cardiovascular: Normal rate, regular rhythm, normal heart sounds and intact distal pulses.   Pulmonary/Chest: Effort normal and breath sounds normal.  Abdominal: Soft. Bowel sounds are normal. There is no tenderness.  Musculoskeletal: Normal range of motion. Exhibits no edema.  Lymphadenopathy:  Has no cervical adenopathy.  Neurological: Pt is alert and oriented to person, place, and time. Pt has normal reflexes. No cranial nerve deficit.  Skin: Skin is warm and dry. No rash noted.  Psychiatric:  Has  normal mood and affect. Behavior is normal.     Assessment & Plan:

## 2011-10-11 NOTE — Assessment & Plan Note (Signed)
Ok for derm referral 

## 2011-10-11 NOTE — Assessment & Plan Note (Signed)
Stable today, but should follow closely given recent elevation at [employer],  to f/u any worsening symptoms or concerns j

## 2011-10-17 ENCOUNTER — Encounter: Payer: Self-pay | Admitting: Internal Medicine

## 2011-11-28 ENCOUNTER — Ambulatory Visit (AMBULATORY_SURGERY_CENTER): Payer: No Typology Code available for payment source | Admitting: *Deleted

## 2011-11-28 VITALS — Ht 70.5 in | Wt 204.3 lb

## 2011-11-28 DIAGNOSIS — Z1211 Encounter for screening for malignant neoplasm of colon: Secondary | ICD-10-CM

## 2011-11-28 MED ORDER — NA SULFATE-K SULFATE-MG SULF 17.5-3.13-1.6 GM/177ML PO SOLN: ORAL | Status: DC

## 2011-12-12 ENCOUNTER — Encounter: Payer: Self-pay | Admitting: Internal Medicine

## 2011-12-12 ENCOUNTER — Ambulatory Visit (AMBULATORY_SURGERY_CENTER): Payer: No Typology Code available for payment source | Admitting: Internal Medicine

## 2011-12-12 VITALS — BP 143/95 | HR 70 | Temp 96.7°F | Resp 15 | Ht 70.0 in | Wt 204.0 lb

## 2011-12-12 DIAGNOSIS — D126 Benign neoplasm of colon, unspecified: Secondary | ICD-10-CM

## 2011-12-12 DIAGNOSIS — Z1211 Encounter for screening for malignant neoplasm of colon: Secondary | ICD-10-CM

## 2011-12-12 MED ORDER — SODIUM CHLORIDE 0.9 % IV SOLN: 500.0000 mL | INTRAVENOUS | Status: DC

## 2011-12-12 NOTE — Op Note (Signed)
[facility] [address] [place], [address]   COLONOSCOPY PROCEDURE REPORT  PATIENT: Lance, [name]  MR#: [id number] BIRTHDATE: 03/11/1956 , 56  yrs. old GENDER: Male ENDOSCOPIST: Beverley Fiedler, MD REFERRED XB:JYNW, Fayrene Fearing PROCEDURE DATE:  12/12/2011 PROCEDURE:   Colonoscopy with cold biopsy polypectomy and Colonoscopy with snare polypectomy ASA CLASS:   Class II INDICATIONS:average risk screening and first colonoscopy. MEDICATIONS: MAC sedation, administered by CRNA and propofol (Diprivan) 300mg  IV  DESCRIPTION OF PROCEDURE:   After the risks benefits and alternatives of the procedure were thoroughly explained, informed consent was obtained.  A digital rectal exam revealed no rectal mass.   The LB CF-[id number]  endoscope was introduced through the anus and advanced to the cecum, which was identified by both the appendix and ileocecal valve. No adverse events experienced. The quality of the prep was Suprep good  The instrument was then slowly withdrawn as the colon was fully examined.    COLON FINDINGS: Three sessile polyps ranging between 3-66mm in size were found in the ascending colon.  Polypectomy was performed using cold snare.  All resections were complete and all polyp tissue was completely retrieved.   Four sessile polyps measuring 4-6 mm in size were found at the hepatic flexure.  Polypectomy was performed with cold forceps and using cold snare.  All resections were complete and all polyp tissue was completely retrieved.   A sessile polyp measuring 6 mm in size was found in the transverse colon.  A polypectomy was performed with a cold snare.  The resection was complete and the polyp tissue was completely retrieved.   Two sessile polyps measuring 4-6 mm in size were found in the descending colon.  Polypectomy was performed using cold snare.  All resections were complete and all polyp tissue was completely retrieved.   Two sessile polyps  ranging between 3-34mm in size were found in the sigmoid colon.  Polypectomy was performed using cold snare.  All resections were complete and all polyp tissue was completely retrieved.   A pedunculated polyp measuring 10 mm in size was found in the sigmoid colon.  A polypectomy was performed using snare cautery.  The resection was complete and the polyp tissue was completely retrieved.   Moderate diverticulosis was noted at the cecum, in the ascending colon, descending colon, and sigmoid colon.  Retroflexed views revealed no abnormalities. The time to cecum=10 minutes 15 seconds.  Withdrawal time=21 minutes 04 seconds.  The scope was withdrawn and the procedure completed. COMPLICATIONS: There were no complications.  ENDOSCOPIC IMPRESSION: 1.   Three sessile polyps ranging between 3-13mm in size were found in the ascending colon; Polypectomy was performed using cold snare 2.   Four sessile polyps measuring 4-6 mm in size were found at the hepatic flexure; Polypectomy was performed with cold forceps and using cold snare 3.   Sessile polyp measuring 6 mm in size was found in the transverse colon; polypectomy was performed with a cold snare 4.   Two sessile polyps measuring 4-6 mm in size were found in the descending colon; Polypectomy was performed using cold snare 5.   Two sessile polyps ranging between 3-49mm in size were found in the sigmoid colon; Polypectomy was performed using cold snare 6.   Pedunculated polyp measuring 10 mm in size was found in the sigmoid colon; polypectomy was performed using snare cautery 7.   Moderate diverticulosis was noted at the cecum, in the ascending colon, descending colon, and sigmoid colon  RECOMMENDATIONS: 1.  Hold aspirin, aspirin products, and anti-inflammatory medication for 2 weeks. 2.  Await pathology results 3.  High fiber diet 4.  Repeat Colonoscopy in 1 year given number of polyps removed today. 5.  You will receive a letter within 3  weeks detailing pathology results from today.  If you do not hear from our office within that time, please call.   eSigned:  Beverley Fiedler, MD 12/12/2011 11:33 AM   cc: Corwin Levins, MD and The Patient   PATIENT NAME:  Lance, [name] MR#: [id number]

## 2011-12-12 NOTE — Progress Notes (Signed)
Patient did not have preoperative order for IV antibiotic SSI prophylaxis. (G8918)   

## 2011-12-12 NOTE — Patient Instructions (Addendum)
YOU HAD AN ENDOSCOPIC PROCEDURE TODAY AT THE  ENDOSCOPY CENTER: Refer to the procedure report that was given to you for any specific questions about what was found during the examination.  If the procedure report does not answer your questions, please call your gastroenterologist to clarify.  If you requested that your care partner not be given the details of your procedure findings, then the procedure report has been included in a sealed envelope for you to review at your convenience later.  YOU SHOULD EXPECT: Some feelings of bloating in the abdomen. Passage of more gas than usual.  Walking can help get rid of the air that was put into your GI tract during the procedure and reduce the bloating. If you had a lower endoscopy (such as a colonoscopy or flexible sigmoidoscopy) you may notice spotting of blood in your stool or on the toilet paper. If you underwent a bowel prep for your procedure, then you may not have a normal bowel movement for a few days.  DIET: Your first meal following the procedure should be a light meal and then it is ok to progress to your normal diet.  A half-sandwich or bowl of soup is an example of a good first meal.  Heavy or fried foods are harder to digest and may make you feel nauseous or bloated.  Likewise meals heavy in dairy and vegetables can cause extra gas to form and this can also increase the bloating.  Drink plenty of fluids but you should avoid alcoholic beverages for 24 hours.  ACTIVITY: Your care partner should take you home directly after the procedure.  You should plan to take it easy, moving slowly for the rest of the day.  You can resume normal activity the day after the procedure however you should NOT DRIVE or use heavy machinery for 24 hours (because of the sedation medicines used during the test).    SYMPTOMS TO REPORT IMMEDIATELY: A gastroenterologist can be reached at any hour.  During normal business hours, 8:30 AM to 5:00 PM Monday through Friday,  call [phone].  After hours and on weekends, please call the GI answering service at [phone] who will take a message and have the physician on call contact you.   Following lower endoscopy (colonoscopy or flexible sigmoidoscopy):  Excessive amounts of blood in the stool  Significant tenderness or worsening of abdominal pains  Swelling of the abdomen that is new, acute  Fever of 100F or higher  FOLLOW UP: If any biopsies were taken you will be contacted by phone or by letter within the next 1-3 weeks.  Call your gastroenterologist if you have not heard about the biopsies in 3 weeks.  Our staff will call the home number listed on your records the next business day following your procedure to check on you and address any questions or concerns that you may have at that time regarding the information given to you following your procedure. This is a courtesy call and so if there is no answer at the home number and we have not heard from you through the emergency physician on call, we will assume that you have returned to your regular daily activities without incident.  SIGNATURES/CONFIDENTIALITY: You and/or your care partner have signed paperwork which will be entered into your electronic medical record.  These signatures attest to the fact that that the information above on your After Visit Summary has been reviewed and is understood.  Full responsibility of the confidentiality of this   discharge information lies with you and/or your care-partner.   Thank-you for choosing us for your healthcare needs. 

## 2011-12-12 NOTE — Progress Notes (Signed)
Patient did not experience any of the following events: a burn prior to discharge; a fall within the facility; wrong site/side/patient/procedure/implant event; or a hospital transfer or hospital admission upon discharge from the facility. (G8907)  

## 2011-12-15 ENCOUNTER — Telehealth: Payer: Self-pay | Admitting: *Deleted

## 2011-12-15 NOTE — Telephone Encounter (Signed)
  Follow up Call-  Call back number 12/12/2011  Post procedure Call Back phone  # [phone]  Permission to leave phone message Yes     Patient questions:  Do you have a fever, pain , or abdominal swelling? no Pain Score  0 *  Have you tolerated food without any problems? yes  Have you been able to return to your normal activities? yes  Do you have any questions about your discharge instructions: Diet   no Medications  no Follow up visit  no  Do you have questions or concerns about your Care? no  Actions: * If pain score is 4 or above: No action needed, pain <4.

## 2011-12-15 NOTE — Telephone Encounter (Signed)
NO ANSWER, MESSAGE LEFT FOR THE PATIENT. 

## 2011-12-16 ENCOUNTER — Encounter: Payer: Self-pay | Admitting: Internal Medicine

## 2011-12-16 NOTE — Telephone Encounter (Signed)
Error

## 2012-01-02 ENCOUNTER — Encounter: Payer: Self-pay | Admitting: Internal Medicine

## 2012-01-02 ENCOUNTER — Telehealth: Payer: Self-pay | Admitting: Internal Medicine

## 2012-01-02 ENCOUNTER — Ambulatory Visit (INDEPENDENT_AMBULATORY_CARE_PROVIDER_SITE_OTHER): Payer: No Typology Code available for payment source | Admitting: Internal Medicine

## 2012-01-02 VITALS — BP 132/80 | HR 87 | Temp 101.6°F | Ht 70.0 in | Wt 203.0 lb

## 2012-01-02 DIAGNOSIS — L237 Allergic contact dermatitis due to plants, except food: Secondary | ICD-10-CM

## 2012-01-02 DIAGNOSIS — L03119 Cellulitis of unspecified part of limb: Secondary | ICD-10-CM

## 2012-01-02 DIAGNOSIS — L02419 Cutaneous abscess of limb, unspecified: Secondary | ICD-10-CM

## 2012-01-02 DIAGNOSIS — L255 Unspecified contact dermatitis due to plants, except food: Secondary | ICD-10-CM

## 2012-01-02 MED ORDER — SULFAMETHOXAZOLE-TRIMETHOPRIM 800-160 MG PO TABS: 1.0000 | ORAL_TABLET | Freq: Two times a day (BID) | ORAL | Status: DC

## 2012-01-02 MED ORDER — TRIAMCINOLONE ACETONIDE 0.1 % EX CREA: TOPICAL_CREAM | Freq: Two times a day (BID) | CUTANEOUS | Status: DC

## 2012-01-02 NOTE — Progress Notes (Signed)
  Subjective:    Patient ID: Lance [name], male    DOB: 1955/09/18, 56 y.o.   MRN: [id number]  HPI  Complains of boils -neck, face and thigh Onset 1 week ago, some improved but some are worse Spontaneous draining in the past 48 hours on face and neck, thigh unchanged History of same  Also complains of itching from poison ivy exposure on left forearm, precipitated by yard work 4 days ago and exposure to poison ivy  Past Medical History  Diagnosis Date  . Impaired glucose tolerance   . HYPERLIPIDEMIA   . ALLERGIC RHINITIS   . OSTEOARTHRITIS, HANDS, BILATERAL   . GERD (gastroesophageal reflux disease)     Review of Systems  Constitutional: Negative for fever, fatigue and unexpected weight change.  HENT: Negative for facial swelling, neck pain and neck stiffness.   Skin: Positive for rash. Negative for wound.        Objective:   Physical Exam BP 132/80  Pulse 87  Temp 101.6 F (38.7 C) (Oral)  Ht 5\' 10"  (1.778 m)  Wt 203 lb (92.08 kg)  BMI 29.13 kg/m2  SpO2 97% Constitutional:  He appears well-developed and well-nourished. No distress. Nontoxic Neck: Normal range of motion. Neck supple. No JVD present. No thyromegaly present.  Cardiovascular: Normal rate, regular rhythm and normal heart sounds.  No murmur heard. no BLE edema Pulmonary/Chest: Effort normal and breath sounds normal. No respiratory distress. no wheezes.  Skin: Resolving boil on posterior neck and left cheek near chin -residual cellulitis but no fluctuance on face or neck. Right anterior thigh with firm red abscess 6 mm x 1.4 mm - remaining skin is warm and dry.  No erythema or ulceration.  Psychiatric: he has a normal mood and affect. behavior is normal. Judgment and thought content normal.    Procedure: I&D of right thigh abscess the patient elects to proceed after verbal consent is obtained. the patient informed of possible risks and complications. Using sterile technique throughout, anesthesia achieved  and I&D of abscess performed. Copious amounts of purulent material expressed followed by irrigation of the wound. Iodoform packing placed into the wound. the patient tolerated the procedure well. Wound care instructions provided.      Assessment & Plan:  Right thigh abscess, I&D today. Septra antibiotics -will also help resolving areas of affected on face and neck, education and reassurance provided  Poison ivy, topical steroids to use as needed. Education reassurance provided

## 2012-01-02 NOTE — Telephone Encounter (Signed)
Caller: Lance [name]/Patient; Patient Name: Lance [name]; PCP: Oliver Barre (Adults only); Best Callback Phone Number: [phone]. Call regarding "boil" to front of neck and right thigh. Onset approximately 1 week. The boils have come up on different areas of his body. Low grade fever noted. Reports exposure to poison ivy around the time these boils started. Reports pus, white drainage at this time. Emergent symptom of "Signs of secondary infection" positive per Poison [place], [place] or [address] Exposure guideline. Disposition: See provider within 4 hours. Care advice and call back parameters given per guideline. Appointment scheduled with Dr. Felicity Coyer 01/02/12 at 9:15am. Patient verbalized understanding.

## 2012-01-02 NOTE — Patient Instructions (Signed)
It was good to see you today. We have drained your abscess on thigh in office today Use Septra antibiotics twice a day with food for the next week Soak in warm bath tub twice daily for at least 10 minutes to clean and massage thigh wound -keep all affected areas clean and dry, cover as needed Triamcinolone cream to poison ivy rash and itch symptoms Call if symptoms worse or unimproved  Abscess Care After An abscess (also called a boil or furuncle) is an infected area that contains a collection of pus. Signs and symptoms of an abscess include pain, tenderness, redness, or hardness, or you may feel a moveable soft area under your skin. An abscess can occur anywhere in the body. The infection may spread to surrounding tissues causing cellulitis. A cut (incision) by the surgeon was made over your abscess and the pus was drained out. Gauze may have been packed into the space to provide a drain that will allow the cavity to heal from the inside outwards. The boil may be painful for 5 to 7 days. Most people with a boil do not have high fevers. Your abscess, if seen early, may not have localized, and may not have been lanced. If not, another appointment may be required for this if it does not get better on its own or with medications. HOME CARE INSTRUCTIONS    Only take over-the-counter or prescription medicines for pain, discomfort, or fever as directed by your caregiver.   When you bathe, soak and then remove gauze or iodoform packs at least daily or as directed by your caregiver. You may then wash the wound gently with mild soapy water. Repack with gauze or do as your caregiver directs.  SEEK IMMEDIATE MEDICAL CARE IF:    You develop increased pain, swelling, redness, drainage, or bleeding in the wound site.   You develop signs of generalized infection including muscle aches, chills, fever, or a general ill feeling.   An oral temperature above 102 F (38.9 C) develops, not controlled by medication.    See your caregiver for a recheck if you develop any of the symptoms described above. If medications (antibiotics) were prescribed, take them as directed. Document Released: 09/26/2004 Document Revised: 06/02/2011 Document Reviewed: 05/24/2007 [facility] Patient Information 2013 [place], [place].   Poison [employer] ivy is a inflammation of the skin (contact dermatitis) caused by touching the allergens on the leaves of the ivy plant following previous exposure to the plant. The rash usually appears 48 hours after exposure. The rash is usually bumps (papules) or blisters (vesicles) in a linear pattern. Depending on your own sensitivity, the rash may simply cause redness and itching, or it may also progress to blisters which may break open. These must be well cared for to prevent secondary bacterial (germ) infection, followed by scarring. Keep any open areas dry, clean, dressed, and covered with an antibacterial ointment if needed. The eyes may also get puffy. The puffiness is worst in the morning and gets better as the day progresses. This dermatitis usually heals without scarring, within 2 to 3 weeks without treatment. HOME CARE INSTRUCTIONS   Thoroughly wash with soap and water as soon as you have been exposed to poison ivy. You have about one half hour to remove the plant resin before it will cause the rash. This washing will destroy the oil or antigen on the skin that is causing, or will cause, the rash. Be sure to wash under your fingernails as any plant resin there  will continue to spread the rash. Do not rub skin vigorously when washing affected area. Poison ivy cannot spread if no oil from the plant remains on your body. A rash that has progressed to weeping sores will not spread the rash unless you have not washed thoroughly. It is also important to wash any clothes you have been wearing as these may carry active allergens. The rash will return if you wear the unwashed clothing, even several days  later. Avoidance of the plant in the future is the best measure. Poison ivy plant can be recognized by the number of leaves. Generally, poison ivy has three leaves with flowering branches on a single stem. Diphenhydramine may be purchased over the counter and used as needed for itching. Do not drive with this medication if it makes you drowsy.Ask your caregiver about medication for children. SEEK MEDICAL CARE IF:  Open sores develop.   Redness spreads beyond area of rash.   You notice purulent (pus-like) discharge.   You have increased pain.   Other signs of infection develop (such as fever).  Document Released: 03/07/2000 Document Revised: 06/02/2011 Document Reviewed: 01/24/2009 [facility] Patient Information 2013 [place], [place].

## 2012-01-06 ENCOUNTER — Encounter (HOSPITAL_COMMUNITY): Payer: Self-pay | Admitting: *Deleted

## 2012-01-06 ENCOUNTER — Emergency Department (HOSPITAL_COMMUNITY)
Admission: EM | Admit: 2012-01-06 | Discharge: 2012-01-06 | Disposition: A | Discharge status: Home / Self Care | Payer: No Typology Code available for payment source | Attending: Emergency Medicine | Admitting: Emergency Medicine

## 2012-01-06 DIAGNOSIS — L03211 Cellulitis of face: Secondary | ICD-10-CM | POA: Insufficient documentation

## 2012-01-06 DIAGNOSIS — L0211 Cutaneous abscess of neck: Secondary | ICD-10-CM | POA: Insufficient documentation

## 2012-01-06 DIAGNOSIS — L03221 Cellulitis of neck: Secondary | ICD-10-CM | POA: Insufficient documentation

## 2012-01-06 DIAGNOSIS — L0201 Cutaneous abscess of face: Secondary | ICD-10-CM | POA: Insufficient documentation

## 2012-01-06 MED ORDER — FENTANYL CITRATE 0.05 MG/ML IJ SOLN
100.0000 ug | Freq: Once | INTRAMUSCULAR | Status: AC
  Administered 2012-01-06: 100 ug via NASAL
  Filled 2012-01-06: qty 2

## 2012-01-06 MED ORDER — IBUPROFEN 800 MG PO TABS
800.0000 mg | ORAL_TABLET | Freq: Once | ORAL | Status: AC
  Administered 2012-01-06: 800 mg via ORAL
  Filled 2012-01-06: qty 1

## 2012-01-06 MED ORDER — SULFAMETHOXAZOLE-TRIMETHOPRIM 800-160 MG PO TABS: 2.0000 | ORAL_TABLET | Freq: Two times a day (BID) | ORAL | Status: DC

## 2012-01-06 MED ORDER — IBUPROFEN 800 MG PO TABS: 800.0000 mg | ORAL_TABLET | Freq: Three times a day (TID) | ORAL | Status: DC | PRN

## 2012-01-06 NOTE — ED Notes (Signed)
Pt c/o abscess on left side of jaw that has drained slightly.  Also st's has abscess on right thigh that is better.

## 2012-01-06 NOTE — ED Notes (Signed)
Pt being moved to CDU, report given to Lyn, RN

## 2012-01-06 NOTE — ED Notes (Signed)
The pt has boils on his lt jaw  And his rt groin for 1-2 weeks.  Pain and swelling is getting worse

## 2012-01-06 NOTE — ED Notes (Signed)
Pt ambulatory leaving ED. Pt given discharge teaching and prescriptions. Pt does not have any further questions upon discharge. Pt states nephew will be taking him home. Pt does not appear to be in any acute distress.

## 2012-01-06 NOTE — ED Notes (Signed)
Trixie Dredge, PA at bedside reviewing procedure and plan with patient.

## 2012-01-06 NOTE — ED Provider Notes (Signed)
History  This chart was scribed for Lance Horn, MD by Albertha Ghee Rifaie. This patient was seen in room TR07C/TR07C and the patient's care was started at 7:44PM.  CSN: [id number]  Arrival date & time 01/06/12  1857   None     Chief Complaint  Patient presents with  . Recurrent Skin Infections    The history is provided by the patient. No language interpreter was used.    [name] is a 56 y.o. male who presents to the Emergency Department complaining of couple of weeks of gradually worsening boils on the left side of the neck and right thigh. He states having fever few days ago and it was measured to be 101 degrees and it improved after taking med. Pt denies having chills, emesis, and nausea. Pt was seen at the [facility] about a week ago for his boils on the right thigh and was drained. He denies smoking and alcohol use.    Past Medical History  Diagnosis Date  . Impaired glucose tolerance   . HYPERLIPIDEMIA   . ALLERGIC RHINITIS   . OSTEOARTHRITIS, HANDS, BILATERAL   . GERD (gastroesophageal reflux disease)     Past Surgical History  Procedure Date  . Inguinal hernia repair 1995    right    Family History  Problem Relation Age of Onset  . Hypertension    . Diabetes    . Hypothyroidism    . Aneurysm    . Prostate cancer Father   . Colon cancer Neg Hx   . Stomach cancer Neg Hx     History  Substance Use Topics  . Smoking status: Never Smoker   . Smokeless tobacco: Never Used  . Alcohol Use: No      Review of Systems  10 Systems reviewed and all are negative for acute change except as noted in the HPI.  Allergies  Review of patient's allergies indicates no known allergies.  Home Medications   Current Outpatient Rx  Name Route Sig Dispense Refill  . ASPIRIN 81 MG PO TBEC Oral Take 1 tablet (81 mg total) by mouth daily. Swallow whole. 30 tablet 12  . OMEPRAZOLE 20 MG PO CPDR Oral Take 2 capsules (40 mg total) by mouth daily. 180  capsule 3  . SIMVASTATIN 40 MG PO TABS Oral Take 1 tablet (40 mg total) by mouth daily. 90 tablet 3  . SULFAMETHOXAZOLE-TRIMETHOPRIM 800-160 MG PO TABS Oral Take 1 tablet by mouth 2 (two) times daily. X 7 days. Started on 01/02/12.    Marland Kitchen TRIAMCINOLONE ACETONIDE 0.1 % EX CREA Topical Apply topically 2 (two) times daily. 30 g 0  . VALACYCLOVIR HCL 1 G PO TABS Oral Take 1 tablet (1,000 mg total) by mouth daily. 90 tablet 3  . IBUPROFEN 800 MG PO TABS Oral Take 1 tablet (800 mg total) by mouth every 8 (eight) hours as needed for pain. 21 tablet 0  . SULFAMETHOXAZOLE-TRIMETHOPRIM 800-160 MG PO TABS Oral Take 2 tablets by mouth 2 (two) times daily. 20 tablet 0    BP 152/87  Pulse 79  Temp 98.9 F (37.2 C) (Oral)  Resp 18  SpO2 97%  Physical Exam  Nursing note and vitals reviewed. Constitutional:       Awake, alert, nontoxic appearance.  HENT:  Head: Atraumatic.  Eyes: Right eye exhibits no discharge. Left eye exhibits no discharge.  Neck: Neck supple.       On the left side neck there is a 4 cm  diameter fluctuance mass with localized redness.  Limited bedside ultra sound of left side neck reveals Sup-Q fluid collection with abscess.   Cardiovascular: Normal rate and regular rhythm.   No murmur heard. Pulmonary/Chest: Effort normal and breath sounds normal. He exhibits no tenderness.  Abdominal: Soft. There is no tenderness. There is no rebound.  Musculoskeletal: He exhibits no tenderness.       Baseline ROM, no obvious new focal weakness.  On the right anterior thigh there is a 4 cm diameter firmness with mild erythema w/o fluctuance.   Neurological:       Mental status and motor strength appears baseline for patient and situation.  Skin: No rash noted.  Psychiatric: He has a normal mood and affect.    ED Course  Procedures (including critical care time)  Labs Reviewed - No data to display No results found.  Marland KitchenDIAGNOSTIC STUDIES: Oxygen Saturation is 97% on room air, normal by  my interpretation.    COORDINATION OF CARE: 7:53PM Patient / Family / Caregiver informed of clinical course, understand medical decision-making process, and agree with plan. 8:00 PM pt will be moved to CDU for I&D   Start documentation by [facility]  INCISION AND DRAINAGE Performed by: Trixie Dredge Consent: Verbal consent obtained. Risks and benefits: risks, benefits and alternatives were discussed Type: abscess  Body area: left neck  Anesthesia: local infiltration  Local anesthetic: lidocaine 2% no epinephrine  Anesthetic total: 7 ml  Complexity: complex Blunt dissection superficially to break up loculations  Drainage: purulent and sanguinous   Drainage amount: moderate  Packing material: 1/4 in iodoform gauze  Patient tolerance: Patient tolerated the procedure well with no immediate complications.  Patient reported he only had one pill of Bactrim left.  I have written prescription to continue bactrim for the next 5 days.  Also added 800mg  ibuprofen.  Patient advised he must return to PCP, UC, or ED in two days for wound check and packing removal.  Return precautions given.  Pt verbalizes understanding and agrees with plan.  End documentation by Trixie Dredge    1. Neck abscess       MDM  Medical screening examination/treatment/procedure(s) were conducted as a shared visit with non-physician practitioner(s) and myself.  I personally evaluated the patient during the encounter PA performed procedure.  I personally performed the services described in this documentation, which was scribed in my presence. The recorded information has been reviewed and considered.         Lance Horn, MD 01/10/12 [phone]

## 2012-01-07 ENCOUNTER — Telehealth: Payer: Self-pay | Admitting: Internal Medicine

## 2012-01-07 NOTE — Telephone Encounter (Signed)
The patient's wife called and is hoping to get packing removed tomorrow that was put in by the emergency dept.  She was offered the next available, but refused.  Please let me know if you want to be double booked.

## 2012-01-07 NOTE — Telephone Encounter (Signed)
Ok for any MD

## 2012-01-08 ENCOUNTER — Ambulatory Visit (INDEPENDENT_AMBULATORY_CARE_PROVIDER_SITE_OTHER): Payer: No Typology Code available for payment source | Admitting: Internal Medicine

## 2012-01-08 ENCOUNTER — Encounter: Payer: Self-pay | Admitting: Internal Medicine

## 2012-01-08 VITALS — BP 120/80 | HR 78 | Temp 97.9°F | Ht 70.5 in | Wt 202.5 lb

## 2012-01-08 DIAGNOSIS — L0211 Cutaneous abscess of neck: Secondary | ICD-10-CM | POA: Insufficient documentation

## 2012-01-08 NOTE — Assessment & Plan Note (Signed)
Improved, packin removed and re-dressed with neosporin and guaze, to finish antibx,  to f/u any worsening symptoms or concerns

## 2012-01-08 NOTE — Patient Instructions (Addendum)
Your packing was removed today, and the abscess does not seem to have returned Continue all other medications as before, including the 7 days of antibiotic you have left Please also use Neosporin (or Triple Antibiotic) OTC with gauze change once per day until it is full healed in about 7-10 days

## 2012-01-08 NOTE — Progress Notes (Signed)
  Subjective:    Patient ID: Lance [name], male    DOB: September 15, 1955, 56 y.o.   MRN: [id number]  HPI  Here to f/u after being seen/tx in ER oct 15 with left neck abscess, I&D done, packed, given new rx for further bactrim and asked to f/u here today;  Pt reports overall improved with less pain/red/swelling/drainage and no fever, chills or other worsening neck LA.  No other abscess like lesion noted.  Overall good compliance with treatment, and good medicine tolerability. Past Medical History  Diagnosis Date  . Impaired glucose tolerance   . HYPERLIPIDEMIA   . ALLERGIC RHINITIS   . OSTEOARTHRITIS, HANDS, BILATERAL   . GERD (gastroesophageal reflux disease)    Past Surgical History  Procedure Date  . Inguinal hernia repair 1995    right    reports that he has never smoked. He has never used smokeless tobacco. He reports that he does not drink alcohol or use illicit drugs. family history includes Aneurysm in an unspecified family member; Diabetes in an unspecified family member; Hypertension in an unspecified family member; Hypothyroidism in an unspecified family member; and Prostate cancer in his father.  There is no history of Colon cancer and Stomach cancer. No Known Allergies Review of Systems All otherwise neg per pt    Objective:   Physical Exam BP 120/80  Pulse 78  Temp 97.9 F (36.6 C) (Oral)  Ht 5' 10.5" (1.791 m)  Wt 202 lb 8 oz (91.853 kg)  BMI 28.64 kg/m2  SpO2 97% Physical Exam  VS noted, not ill appearing Constitutional: Pt appears well-developed and well-nourished.  HENT: Head: Normocephalic.  Right Ear: External ear normal.  Left Ear: External ear normal.  Eyes: Conjunctivae and EOM are normal. Pupils are equal, round, and reactive to light.  Neck: Normal range of motion. Neck supple.  Left neck with mild red/swelling tender site of prior abscess with minimal serosanguiness drainage, nonpurulent , no neck LA Cardiovascular: Normal rate and regular rhythm.     Pulmonary/Chest: Effort normal and breath sounds normal.  Neurological: Pt is alert. Not confused  Psychiatric: Pt behavior is normal. Thought content normal.     Assessment & Plan:

## 2012-10-08 ENCOUNTER — Encounter: Payer: Self-pay | Admitting: Internal Medicine

## 2012-10-15 ENCOUNTER — Encounter: Payer: No Typology Code available for payment source | Admitting: Internal Medicine

## 2012-10-18 ENCOUNTER — Encounter: Payer: Self-pay | Admitting: Internal Medicine

## 2012-10-21 ENCOUNTER — Other Ambulatory Visit (INDEPENDENT_AMBULATORY_CARE_PROVIDER_SITE_OTHER): Payer: BC Managed Care – PPO

## 2012-10-21 DIAGNOSIS — Z0000 Encounter for general adult medical examination without abnormal findings: Secondary | ICD-10-CM

## 2012-10-21 LAB — CBC WITH DIFFERENTIAL/PLATELET
Basophils Absolute: 0 10*3/uL (ref 0.0–0.1)
Basophils Relative: 0.2 % (ref 0.0–3.0)
HCT: 43.8 % (ref 39.0–52.0)
Hemoglobin: 14.6 g/dL (ref 13.0–17.0)
Lymphocytes Relative: 37.7 % (ref 12.0–46.0)
Lymphs Abs: 2.8 10*3/uL (ref 0.7–4.0)
Monocytes Relative: 6.3 % (ref 3.0–12.0)
Neutro Abs: 4 10*3/uL (ref 1.4–7.7)
RBC: 4.9 Mil/uL (ref 4.22–5.81)
RDW: 12.7 % (ref 11.5–14.6)

## 2012-10-21 LAB — HEPATIC FUNCTION PANEL
ALT: 44 U/L (ref 0–53)
AST: 32 U/L (ref 0–37)
Albumin: 4.5 g/dL (ref 3.5–5.2)
Alkaline Phosphatase: 39 U/L (ref 39–117)
Total Protein: 7.3 g/dL (ref 6.0–8.3)

## 2012-10-21 LAB — BASIC METABOLIC PANEL
Calcium: 9.6 mg/dL (ref 8.4–10.5)
Creatinine, Ser: 1.2 mg/dL (ref 0.4–1.5)
GFR: 82.79 mL/min (ref >60.00)
Sodium: 139 mEq/L (ref 135–145)

## 2012-10-21 LAB — LIPID PANEL
Cholesterol: 148 mg/dL (ref 0–200)
HDL: 41 mg/dL (ref >39.00)
Total CHOL/HDL Ratio: 4
Triglycerides: 159 mg/dL — ABNORMAL HIGH (ref 0.0–149.0)

## 2012-10-21 LAB — URINALYSIS, ROUTINE W REFLEX MICROSCOPIC
Bilirubin Urine: NEGATIVE
Ketones, ur: NEGATIVE
Leukocytes, UA: NEGATIVE
Nitrite: NEGATIVE

## 2012-10-21 LAB — TSH: TSH: 2.09 u[IU]/mL (ref 0.35–5.50)

## 2012-10-22 ENCOUNTER — Ambulatory Visit (INDEPENDENT_AMBULATORY_CARE_PROVIDER_SITE_OTHER): Payer: BC Managed Care – PPO | Admitting: Internal Medicine

## 2012-10-22 ENCOUNTER — Encounter: Payer: Self-pay | Admitting: Internal Medicine

## 2012-10-22 VITALS — BP 126/82 | HR 74 | Temp 98.6°F | Ht 70.5 in | Wt 214.0 lb

## 2012-10-22 DIAGNOSIS — J309 Allergic rhinitis, unspecified: Secondary | ICD-10-CM

## 2012-10-22 DIAGNOSIS — Z0000 Encounter for general adult medical examination without abnormal findings: Secondary | ICD-10-CM

## 2012-10-22 MED ORDER — SIMVASTATIN 40 MG PO TABS: 40.0000 mg | ORAL_TABLET | Freq: Every day | ORAL | Status: DC

## 2012-10-22 MED ORDER — FLUTICASONE PROPIONATE 50 MCG/ACT NA SUSP: 2.0000 | Freq: Every day | NASAL | Status: DC

## 2012-10-22 NOTE — Progress Notes (Signed)
Subjective:    Patient ID: Lance [name], male    DOB: February 03, 1956, 57 y.o.   MRN: [id number]  HPI   Here for wellness and f/u;  Overall doing ok;  Pt denies CP, worsening SOB, DOE, wheezing, orthopnea, PND, worsening LE edema, palpitations, dizziness or syncope.  Pt denies neurological change such as new headache, facial or extremity weakness.  Pt denies polydipsia, polyuria, or low sugar symptoms. Pt states overall good compliance with treatment and medications, good tolerability, and has been trying to follow lower cholesterol diet.  Pt denies worsening depressive symptoms, suicidal ideation or panic. No fever, night sweats, wt loss, loss of appetite, or other constitutional symptoms.  Pt states good ability with ADL's, has low fall risk, home safety reviewed and adequate, no other significant changes in hearing or vision, and only occasionally active with exercise.  Had some left shoulder pain last wk, works on machines, now resolved. Has f/u colonscopy soon as last colon 2013 with 12 polyps. Does have several wks ongoing nasal allergy symptoms with clearish congestion, itch and sneezing, without fever, pain, ST, cough, swelling or wheezing. Past Medical History  Diagnosis Date  . Impaired glucose tolerance   . HYPERLIPIDEMIA   . ALLERGIC RHINITIS   . OSTEOARTHRITIS, HANDS, BILATERAL   . GERD (gastroesophageal reflux disease)    Past Surgical History  Procedure Laterality Date  . Inguinal hernia repair  1995    right    reports that he has never smoked. He has never used smokeless tobacco. He reports that he does not drink alcohol or use illicit drugs. family history includes Aneurysm in an unspecified family member; Diabetes in an unspecified family member; Hypertension in an unspecified family member; Hypothyroidism in an unspecified family member; and Prostate cancer in his father.  There is no history of Colon cancer and Stomach cancer. No Known Allergies Current Outpatient  Prescriptions on File Prior to Visit  Medication Sig Dispense Refill  . ibuprofen (ADVIL,MOTRIN) 800 MG tablet Take 1 tablet (800 mg total) by mouth every 8 (eight) hours as needed for pain.  21 tablet  0  . triamcinolone cream (KENALOG) 0.1 % Apply topically 2 (two) times daily.  30 g  0  . aspirin 81 MG EC tablet Take 1 tablet (81 mg total) by mouth daily. Swallow whole.  30 tablet  12  . omeprazole (PRILOSEC) 20 MG capsule Take 2 capsules (40 mg total) by mouth daily.  180 capsule  3   No current facility-administered medications on file prior to visit.   Review of Systems Constitutional: Negative for diaphoresis, activity change, appetite change or unexpected weight change.  HENT: Negative for hearing loss, ear pain, facial swelling, mouth sores and neck stiffness.   Eyes: Negative for pain, redness and visual disturbance.  Respiratory: Negative for shortness of breath and wheezing.   Cardiovascular: Negative for chest pain and palpitations.  Gastrointestinal: Negative for diarrhea, blood in stool, abdominal distention or other pain Genitourinary: Negative for hematuria, flank pain or change in urine volume.  Musculoskeletal: Negative for myalgias and joint swelling.  Skin: Negative for color change and wound.  Neurological: Negative for syncope and numbness. other than noted Hematological: Negative for adenopathy.  Psychiatric/Behavioral: Negative for hallucinations, self-injury, decreased concentration and agitation.      Objective:   Physical Exam BP 126/82  Pulse 74  Temp(Src) 98.6 F (37 C) (Oral)  Ht 5' 10.5" (1.791 m)  Wt 214 lb (97.07 kg)  BMI 30.26 kg/m2  SpO2 97%  VS noted,  Constitutional: Pt is oriented to person, place, and time. Appears well-developed and well-nourished.  Head: Normocephalic and atraumatic.  Right Ear: External ear normal.  Left Ear: External ear normal.  Nose: Nose normal.  Mouth/Throat: Oropharynx is clear and moist.  Eyes: Conjunctivae and  EOM are normal. Pupils are equal, round, and reactive to light.  Neck: Normal range of motion. Neck supple. No JVD present. No tracheal deviation present.  Cardiovascular: Normal rate, regular rhythm, normal heart sounds and intact distal pulses.   Pulmonary/Chest: Effort normal and breath sounds normal.  Abdominal: Soft. Bowel sounds are normal. There is no tenderness. No HSM  Musculoskeletal: Normal range of motion. Exhibits no edema.  Lymphadenopathy:  Has no cervical adenopathy.  Neurological: Pt is alert and oriented to person, place, and time. Pt has normal reflexes. No cranial nerve deficit.  Skin: Skin is warm and dry. No rash noted.  Psychiatric:  Has  normal mood and affect. Behavior is normal.     Assessment & Plan:

## 2012-10-22 NOTE — Assessment & Plan Note (Signed)
Ok for flonase asd prn

## 2012-10-22 NOTE — Assessment & Plan Note (Signed)

## 2012-10-22 NOTE — Patient Instructions (Addendum)
Please take all new medication as prescribed - the flonase if needed Please continue all other medications as before, and refills have been done if requested. - the cholesterol medication Please have the pharmacy call with any other refills you may need. Please continue your efforts at being more active, low cholesterol diet, and weight control. You are otherwise up to date with prevention measures today. Please keep your appointments with your specialists as you may have planned Please go to the LAB in the Basement (turn left off the elevator) for the tests to be done today You will be contacted by phone if any changes need to be made immediately.  Otherwise, you will receive a letter about your results with an explanation, but please check with MyChart first. Please remember to sign up for My Chart if you have not done so, as this will be important to you in the future with finding out test results, communicating by private email, and scheduling acute appointments online when needed.  Please return in 1 year for your yearly visit, or sooner if needed, with Lab testing done 3-5 days before

## 2012-10-27 ENCOUNTER — Other Ambulatory Visit: Payer: Self-pay | Admitting: Internal Medicine

## 2012-11-17 ENCOUNTER — Other Ambulatory Visit: Payer: Self-pay | Admitting: Internal Medicine

## 2012-12-20 ENCOUNTER — Ambulatory Visit (AMBULATORY_SURGERY_CENTER): Payer: Self-pay | Admitting: *Deleted

## 2012-12-20 VITALS — Ht 70.5 in | Wt 215.0 lb

## 2012-12-20 DIAGNOSIS — Z86010 Personal history of colonic polyps: Secondary | ICD-10-CM

## 2012-12-20 MED ORDER — MOVIPREP 100 G PO SOLR: ORAL | Status: DC

## 2012-12-20 NOTE — Progress Notes (Signed)
Patient denies any allergies to eggs or soy. Patient denies any problems with anesthesia.  

## 2012-12-21 ENCOUNTER — Encounter: Payer: Self-pay | Admitting: Internal Medicine

## 2013-01-07 ENCOUNTER — Ambulatory Visit (AMBULATORY_SURGERY_CENTER): Payer: BC Managed Care – PPO | Admitting: Internal Medicine

## 2013-01-07 ENCOUNTER — Encounter: Payer: Self-pay | Admitting: Internal Medicine

## 2013-01-07 VITALS — BP 133/101 | HR 68 | Temp 97.4°F | Resp 17 | Ht 70.0 in | Wt 215.0 lb

## 2013-01-07 DIAGNOSIS — D126 Benign neoplasm of colon, unspecified: Secondary | ICD-10-CM

## 2013-01-07 DIAGNOSIS — Z1211 Encounter for screening for malignant neoplasm of colon: Secondary | ICD-10-CM

## 2013-01-07 DIAGNOSIS — Z86010 Personal history of colonic polyps: Secondary | ICD-10-CM

## 2013-01-07 MED ORDER — SODIUM CHLORIDE 0.9 % IV SOLN: 500.0000 mL | INTRAVENOUS | Status: DC

## 2013-01-07 NOTE — Op Note (Signed)
[facility] [address] [place], [address]   COLONOSCOPY PROCEDURE REPORT  PATIENT: Lance, [name]  MR#: [id number] BIRTHDATE: 13-Aug-1955 , 57  yrs. old GENDER: Male ENDOSCOPIST: Beverley Fiedler, MD PROCEDURE DATE:  01/07/2013 PROCEDURE:   Colonoscopy with cold biopsy polypectomy and Colonoscopy with snare polypectomy First Screening Colonoscopy - Avg.  risk and is 50 yrs.  old or older - No.  Prior Negative Screening - Now for repeat screening. N/A  History of Adenoma - Now for follow-up colonoscopy & has been > or = to 3 yrs.  Yes hx of adenoma.  Has been 3 or more years since last colonoscopy.  History of Adenoma - Now for follow-up colonoscopy & has been > or = to 3 yrs.  No.  It has been less than 3 yrs since last colonoscopy.  Medical reason.  Polyps Removed Today? Yes. ASA CLASS:   Class II INDICATIONS:elevated risk screening and Patient's personal history of adenomatous colon polyps. MEDICATIONS: MAC sedation, administered by CRNA and Propofol (Diprivan) 280  DESCRIPTION OF PROCEDURE:   After the risks benefits and alternatives of the procedure were thoroughly explained, informed consent was obtained.  A digital rectal exam revealed no rectal mass.   The LB [id number]  endoscope was introduced through the anus and advanced to the cecum, which was identified by both the appendix and ileocecal valve. No adverse events experienced. The quality of the prep was good, using MoviPrep  The instrument was then slowly withdrawn as the colon was fully examined.    COLON FINDINGS: A sessile polyp measuring 5 mm in size was found in the ascending colon.  A polypectomy was performed with a cold snare.  The resection was complete and the polyp tissue was completely retrieved.   Two diminutive sessile polyps were found in the descending colon and sigmoid colon.  Polypectomy was performed with cold forceps.  All resections were complete and all  polyp tissue was completely retrieved.   Mild diverticulosis was noted in the ascending colon, descending colon, and sigmoid colon. Retroflexed views revealed internal hemorrhoids. The time to cecum=0 minutes 54 seconds.  Withdrawal time=16 minutes 49 seconds. The scope was withdrawn and the procedure completed.  COMPLICATIONS: There were no complications.  ENDOSCOPIC IMPRESSION: 1.   Sessile polyp measuring 5 mm in size was found in the ascending colon; polypectomy was performed with a cold snare 2.   Two diminutive sessile polyps were found in the descending colon and sigmoid colon; Polypectomy was performed with cold forceps 3.   Mild diverticulosis was noted in the ascending colon, descending colon, and sigmoid colon      RECOMMENDATIONS: 1.  Await pathology results 2.  High fiber diet 3.  Timing of repeat colonoscopy will be determined by pathology findings. 4.  You will receive a letter within 1-2 weeks with the results of your biopsy as well as final recommendations.  Please call my office if you have not received a letter after 3 weeks.   eSigned:  Beverley Fiedler, MD 01/07/2013 12:22 PM   cc: Corwin Levins, MD and The Patient   PATIENT NAME:  Lance, [name] MR#: [id number]

## 2013-01-07 NOTE — Patient Instructions (Signed)
YOU HAD AN ENDOSCOPIC PROCEDURE TODAY AT THE [facility]: Refer to the procedure report that was given to you for any specific questions about what was found during the examination.  If the procedure report does not answer your questions, please call your gastroenterologist to clarify.  If you requested that your care partner not be given the details of your procedure findings, then the procedure report has been included in a sealed envelope for you to review at your convenience later.  YOU SHOULD EXPECT: Some feelings of bloating in the abdomen. Passage of more gas than usual.  Walking can help get rid of the air that was put into your GI tract during the procedure and reduce the bloating. If you had a lower endoscopy (such as a colonoscopy or flexible sigmoidoscopy) you may notice spotting of blood in your stool or on the toilet paper. If you underwent a bowel prep for your procedure, then you may not have a normal bowel movement for a few days.  DIET: Your first meal following the procedure should be a light meal and then it is ok to progress to your normal diet.  A half-sandwich or bowl of soup is an example of a good first meal.  Heavy or fried foods are harder to digest and may make you feel nauseous or bloated.  Likewise meals heavy in dairy and vegetables can cause extra gas to form and this can also increase the bloating.  Drink plenty of fluids but you should avoid alcoholic beverages for 24 hours.  ACTIVITY: Your care partner should take you home directly after the procedure.  You should plan to take it easy, moving slowly for the rest of the day.  You can resume normal activity the day after the procedure however you should NOT DRIVE or use heavy machinery for 24 hours (because of the sedation medicines used during the test).    SYMPTOMS TO REPORT IMMEDIATELY: A gastroenterologist can be reached at any hour.  During normal business hours, 8:30 AM to 5:00 PM Monday through Friday,  call [phone].  After hours and on weekends, please call the GI answering service at [phone] emergency  Number  who will take a message and have the physician on call contact you.   Following lower endoscopy (colonoscopy or flexible sigmoidoscopy):  Excessive amounts of blood in the stool  Significant tenderness or worsening of abdominal pains  Swelling of the abdomen that is new, acute  Fever of 100F or higher  FOLLOW UP: If any biopsies were taken you will be contacted by phone or by letter within the next 1-3 weeks.  Call your gastroenterologist if you have not heard about the biopsies in 3 weeks.  Our staff will call the home number listed on your records the next business day following your procedure to check on you and address any questions or concerns that you may have at that time regarding the information given to you following your procedure. This is a courtesy call and so if there is no answer at the home number and we have not heard from you through the emergency physician on call, we will assume that you have returned to your regular daily activities without incident.  SIGNATURES/CONFIDENTIALITY: You and/or your care partner have signed paperwork which will be entered into your electronic medical record.  These signatures attest to the fact that that the information above on your After Visit Summary has been reviewed and is understood.  Full responsibility of  the confidentiality of this discharge information lies with you and/or your care-partner.  Handout on polyps, diverticulosis, high fiber diet

## 2013-01-07 NOTE — Progress Notes (Signed)
Patient did not experience any of the following events: a burn prior to discharge; a fall within the facility; wrong site/side/patient/procedure/implant event; or a hospital transfer or hospital admission upon discharge from the facility. (G8907)Patient did not have preoperative order for IV antibiotic SSI prophylaxis. (G8918) ewm 

## 2013-01-07 NOTE — Progress Notes (Signed)
Report to pacu rn, vss, bbs=clear 

## 2013-01-07 NOTE — Progress Notes (Signed)
Called to room to assist during endoscopic procedure.  Patient ID and intended procedure confirmed with present staff. Received instructions for my participation in the procedure from the performing physician.  

## 2013-01-10 ENCOUNTER — Telehealth: Payer: Self-pay | Admitting: *Deleted

## 2013-01-10 NOTE — Telephone Encounter (Signed)
  Follow up Call-  Call back number 01/07/2013 12/12/2011  Post procedure Call Back phone  # [phone]  Permission to leave phone message Yes Yes     Patient questions:  Do you have a fever, pain , or abdominal swelling? no Pain Score  0 *  Have you tolerated food without any problems? yes  Have you been able to return to your normal activities? yes  Do you have any questions about your discharge instructions: Diet   no Medications  no Follow up visit  no  Do you have questions or concerns about your Care? no  Actions: * If pain score is 4 or above: No action needed, pain <4.

## 2013-01-12 ENCOUNTER — Encounter: Payer: Self-pay | Admitting: Internal Medicine

## 2013-01-26 ENCOUNTER — Ambulatory Visit (INDEPENDENT_AMBULATORY_CARE_PROVIDER_SITE_OTHER): Payer: BC Managed Care – PPO | Admitting: Internal Medicine

## 2013-01-26 ENCOUNTER — Encounter: Payer: Self-pay | Admitting: Internal Medicine

## 2013-01-26 VITALS — BP 146/100 | HR 97 | Temp 98.6°F | Ht 70.5 in | Wt 214.0 lb

## 2013-01-26 DIAGNOSIS — R7309 Other abnormal glucose: Secondary | ICD-10-CM

## 2013-01-26 DIAGNOSIS — R7302 Impaired glucose tolerance (oral): Secondary | ICD-10-CM

## 2013-01-26 DIAGNOSIS — E785 Hyperlipidemia, unspecified: Secondary | ICD-10-CM

## 2013-01-26 DIAGNOSIS — J209 Acute bronchitis, unspecified: Secondary | ICD-10-CM | POA: Insufficient documentation

## 2013-01-26 MED ORDER — AZITHROMYCIN 250 MG PO TABS: ORAL_TABLET | ORAL | Status: DC

## 2013-01-26 MED ORDER — HYDROCODONE-HOMATROPINE 5-1.5 MG/5ML PO SYRP: 5.0000 mL | ORAL_SOLUTION | Freq: Four times a day (QID) | ORAL | Status: DC | PRN

## 2013-01-26 NOTE — Assessment & Plan Note (Signed)
Mild to mod, for antibx course,  to f/u any worsening symptoms or concerns, also work note given, and cough med for prn use

## 2013-01-26 NOTE — Assessment & Plan Note (Signed)
stable overall by history and exam, recent data reviewed with pt, and pt to continue medical treatment as before,  to f/u any worsening symptoms or concerns Lab Results  Component Value Date   LDLCALC 75 10/21/2012   For cont'd diet effort, f/u labs next visit

## 2013-01-26 NOTE — Progress Notes (Signed)
Subjective:    Patient ID: Lance [name], male    DOB: Feb 08, 1956, 57 y.o.   MRN: [id number]  HPI  Here with acute onset mild to mod 2-3 days ST, HA, general weakness and malaise, with prod cough greenish sputum, but Pt denies chest pain, increased sob or doe, wheezing, orthopnea, PND, increased LE swelling, palpitations, dizziness or syncope.  Pt denies polydipsia, polyuria,   Pt states overall good compliance with meds, trying to follow lower cholesterol diet, wt overall stable. Pt denies new neurological symptoms such as new headache, or facial or extremity weakness or numbness Past Medical History  Diagnosis Date  . Impaired glucose tolerance   . HYPERLIPIDEMIA   . ALLERGIC RHINITIS   . OSTEOARTHRITIS, HANDS, BILATERAL   . GERD (gastroesophageal reflux disease)    Past Surgical History  Procedure Laterality Date  . Inguinal hernia repair  1995    right  . Cyst excision      neck and leg,drained and packed    reports that he has never smoked. He has never used smokeless tobacco. He reports that he does not drink alcohol or use illicit drugs. family history includes Aneurysm in an other family member; Diabetes in an other family member; Hypertension in an other family member; Hypothyroidism in an other family member; Prostate cancer in his father. There is no history of Colon cancer or Stomach cancer. No Known Allergies' Current Outpatient Prescriptions on File Prior to Visit  Medication Sig Dispense Refill  . aspirin 81 MG tablet Take 81 mg by mouth daily.      . fluticasone (FLONASE) 50 MCG/ACT nasal spray Place 2 sprays into the nose daily.  16 g  2  . ibuprofen (ADVIL,MOTRIN) 800 MG tablet Take 1 tablet (800 mg total) by mouth every 8 (eight) hours as needed for pain.  21 tablet  0  . omeprazole (PRILOSEC) 20 MG capsule TAKE TWO CAPSULES BY MOUTH EVERY DAY  180 capsule  3  . sennosides-docusate sodium (SENOKOT-S) 8.6-50 MG tablet Take 1 tablet by mouth daily.      .  simvastatin (ZOCOR) 40 MG tablet Take 1 tablet (40 mg total) by mouth daily.  90 tablet  3  . triamcinolone cream (KENALOG) 0.1 % Apply topically 2 (two) times daily.  30 g  0  . valACYclovir (VALTREX) 1000 MG tablet TAKE ONE TABLET BY MOUTH EVERY DAY  90 tablet  0  . aspirin 81 MG EC tablet Take 1 tablet (81 mg total) by mouth daily. Swallow whole.  30 tablet  12   No current facility-administered medications on file prior to visit.   Review of Systems  Constitutional: Negative for unexpected weight change, or unusual diaphoresis  HENT: Negative for tinnitus.   Eyes: Negative for photophobia and visual disturbance.  Respiratory: Negative for choking and stridor.   Gastrointestinal: Negative for vomiting and blood in stool.  Genitourinary: Negative for hematuria and decreased urine volume.  Musculoskeletal: Negative for acute joint swelling Skin: Negative for color change and wound.  Neurological: Negative for tremors and numbness other than noted  Psychiatric/Behavioral: Negative for decreased concentration or  hyperactivity.       Objective:   Physical Exam BP 146/100  Pulse 97  Temp(Src) 98.6 F (37 C) (Oral)  Ht 5' 10.5" (1.791 m)  Wt 214 lb (97.07 kg)  BMI 30.26 kg/m2  SpO2 98% VS noted, mild ill Constitutional: Pt appears well-developed and well-nourished.  HENT: Head: NCAT.  Right Ear: External ear normal.  Left Ear: External ear normal.  Bilat tm's with mild erythema.  Max sinus areas non tender.  Pharynx with mild erythema, no exudate Eyes: Conjunctivae and EOM are normal. Pupils are equal, round, and reactive to light.  Neck: Normal range of motion. Neck supple.  Cardiovascular: Normal rate and regular rhythm.   Pulmonary/Chest: Effort normal and breath sounds mild decreased but no rales or wheezing.  Neurological: Pt is alert. Not confused  Skin: Skin is warm. No erythema.  Psychiatric: Pt behavior is normal. Thought content normal.     Assessment & Plan:

## 2013-01-26 NOTE — Assessment & Plan Note (Signed)
Asympt, cont diet and wt control efforts,  to f/u any worsening symptoms or concerns, for lab next visit

## 2013-01-26 NOTE — Progress Notes (Signed)
Pre-visit discussion using our clinic review tool. No additional management support is needed unless otherwise documented below in the visit note.  

## 2013-01-26 NOTE — Patient Instructions (Addendum)
Please take all new medication as prescribed Please continue all other medications as before, and refills have been done if requested.  You can also take OTC Mucinex (or it's generic off brand) for congestion, and tylenol as needed for pain.  Please remember to sign up for My Chart if you have not done so, as this will be important to you in the future with finding out test results, communicating by private email, and scheduling acute appointments online when needed.  Please return in July 2015, or sooner if needed, with Lab testing done 3-5 days before

## 2013-02-15 ENCOUNTER — Other Ambulatory Visit: Payer: Self-pay | Admitting: Internal Medicine

## 2013-03-18 ENCOUNTER — Other Ambulatory Visit: Payer: Self-pay | Admitting: Internal Medicine

## 2013-09-17 ENCOUNTER — Other Ambulatory Visit: Payer: Self-pay | Admitting: Internal Medicine

## 2013-10-28 ENCOUNTER — Other Ambulatory Visit (INDEPENDENT_AMBULATORY_CARE_PROVIDER_SITE_OTHER): Payer: BC Managed Care – PPO

## 2013-10-28 DIAGNOSIS — Z0000 Encounter for general adult medical examination without abnormal findings: Secondary | ICD-10-CM

## 2013-10-28 LAB — CBC WITH DIFFERENTIAL/PLATELET
BASOS ABS: 0 10*3/uL (ref 0.0–0.1)
Basophils Relative: 0.3 % (ref 0.0–3.0)
EOS ABS: 0.2 10*3/uL (ref 0.0–0.7)
Eosinophils Relative: 2.6 % (ref 0.0–5.0)
HEMATOCRIT: 41.5 % (ref 39.0–52.0)
Hemoglobin: 13.8 g/dL (ref 13.0–17.0)
LYMPHS ABS: 2 10*3/uL (ref 0.7–4.0)
Lymphocytes Relative: 30.3 % (ref 12.0–46.0)
MCHC: 33.3 g/dL (ref 30.0–36.0)
MCV: 88.2 fl (ref 78.0–100.0)
MONOS PCT: 6.4 % (ref 3.0–12.0)
Monocytes Absolute: 0.4 10*3/uL (ref 0.1–1.0)
Neutro Abs: 4 10*3/uL (ref 1.4–7.7)
Neutrophils Relative %: 60.4 % (ref 43.0–77.0)
PLATELETS: 194 10*3/uL (ref 150.0–400.0)
RBC: 4.7 Mil/uL (ref 4.22–5.81)
RDW: 13.2 % (ref 11.5–15.5)
WBC: 6.6 10*3/uL (ref 4.0–10.5)

## 2013-10-28 LAB — URINALYSIS, ROUTINE W REFLEX MICROSCOPIC
BILIRUBIN URINE: NEGATIVE
Ketones, ur: NEGATIVE
LEUKOCYTES UA: NEGATIVE
NITRITE: NEGATIVE
SPECIFIC GRAVITY, URINE: 1.015 (ref 1.000–1.030)
Total Protein, Urine: NEGATIVE
Urine Glucose: NEGATIVE
Urobilinogen, UA: 0.2 (ref 0.0–1.0)
WBC, UA: NONE SEEN (ref 0–?)
pH: 6.5 (ref 5.0–8.0)

## 2013-10-28 LAB — LIPID PANEL
Cholesterol: 160 mg/dL (ref 0–200)
HDL: 46.6 mg/dL (ref >39.00)
LDL CALC: 98 mg/dL (ref 0–99)
NONHDL: 113.4
TRIGLYCERIDES: 78 mg/dL (ref 0.0–149.0)
Total CHOL/HDL Ratio: 3
VLDL: 15.6 mg/dL (ref 0.0–40.0)

## 2013-10-28 LAB — HEPATIC FUNCTION PANEL
ALT: 54 U/L — ABNORMAL HIGH (ref 0–53)
AST: 36 U/L (ref 0–37)
Albumin: 4.5 g/dL (ref 3.5–5.2)
Alkaline Phosphatase: 41 U/L (ref 39–117)
BILIRUBIN TOTAL: 1.9 mg/dL — AB (ref 0.2–1.2)
Bilirubin, Direct: 0.2 mg/dL (ref 0.0–0.3)
Total Protein: 7.1 g/dL (ref 6.0–8.3)

## 2013-10-28 LAB — BASIC METABOLIC PANEL
BUN: 16 mg/dL (ref 6–23)
CHLORIDE: 101 meq/L (ref 96–112)
CO2: 29 meq/L (ref 19–32)
Calcium: 9.3 mg/dL (ref 8.4–10.5)
Creatinine, Ser: 1.2 mg/dL (ref 0.4–1.5)
GFR: 77.11 mL/min (ref >60.00)
GLUCOSE: 95 mg/dL (ref 70–99)
Potassium: 4 mEq/L (ref 3.5–5.1)
Sodium: 137 mEq/L (ref 135–145)

## 2013-10-28 LAB — PSA: PSA: 0.48 ng/mL (ref 0.10–4.00)

## 2013-10-28 LAB — TSH: TSH: 2.25 u[IU]/mL (ref 0.35–4.50)

## 2013-11-04 ENCOUNTER — Other Ambulatory Visit: Payer: Self-pay | Admitting: Internal Medicine

## 2013-11-04 ENCOUNTER — Ambulatory Visit (INDEPENDENT_AMBULATORY_CARE_PROVIDER_SITE_OTHER): Payer: BC Managed Care – PPO | Admitting: Internal Medicine

## 2013-11-04 ENCOUNTER — Encounter: Payer: Self-pay | Admitting: Internal Medicine

## 2013-11-04 ENCOUNTER — Other Ambulatory Visit: Payer: BC Managed Care – PPO

## 2013-11-04 VITALS — BP 160/100 | HR 87 | Temp 98.3°F | Ht 70.5 in | Wt 203.1 lb

## 2013-11-04 DIAGNOSIS — R030 Elevated blood-pressure reading, without diagnosis of hypertension: Secondary | ICD-10-CM

## 2013-11-04 DIAGNOSIS — R7989 Other specified abnormal findings of blood chemistry: Secondary | ICD-10-CM

## 2013-11-04 DIAGNOSIS — G4700 Insomnia, unspecified: Secondary | ICD-10-CM

## 2013-11-04 DIAGNOSIS — R945 Abnormal results of liver function studies: Secondary | ICD-10-CM

## 2013-11-04 DIAGNOSIS — I10 Essential (primary) hypertension: Secondary | ICD-10-CM | POA: Insufficient documentation

## 2013-11-04 DIAGNOSIS — Z0000 Encounter for general adult medical examination without abnormal findings: Secondary | ICD-10-CM

## 2013-11-04 DIAGNOSIS — A64 Unspecified sexually transmitted disease: Secondary | ICD-10-CM

## 2013-11-04 MED ORDER — ZOLPIDEM TARTRATE 10 MG PO TABS: 10.0000 mg | ORAL_TABLET | Freq: Every evening | ORAL | Status: DC | PRN

## 2013-11-04 NOTE — Patient Instructions (Signed)
Please take all new medication as prescribed  - the sleep medication if needed  Please continue to check your Blood Pressure on a regular basis at home; your goal is to be less than 140/90  Please continue all other medications as before, and refills have been done if requested.  Please have the pharmacy call with any other refills you may need.  Please continue your efforts at being more active, low cholesterol diet, and weight control.  You are otherwise up to date with prevention measures today.  Please keep your appointments with your specialists as you may have planned  Please go to the LAB in the Basement (turn left off the elevator) for the tests to be done today  You will be contacted by phone if any changes need to be made immediately.  Otherwise, you will receive a letter about your results with an explanation, but please check with MyChart first.  Please remember to sign up for MyChart if you have not done so, as this will be important to you in the future with finding out test results, communicating by private email, and scheduling acute appointments online when needed.  Please return in 1 year for your yearly visit, or sooner if needed, with Lab testing done 3-5 days before

## 2013-11-04 NOTE — Progress Notes (Signed)
Subjective:    Patient ID: Lance [name], male    DOB: 1955-04-17, 58 y.o.   MRN: [id number]  HPI  Here for wellness and f/u;  Overall doing ok;  Pt denies CP, worsening SOB, DOE, wheezing, orthopnea, PND, worsening LE edema, palpitations, dizziness or syncope.  Pt denies neurological change such as new headache, facial or extremity weakness.  Pt denies polydipsia, polyuria, or low sugar symptoms. Pt states overall good compliance with treatment and medications, good tolerability, and has been trying to follow lower cholesterol diet.  Pt denies worsening depressive symptoms, suicidal ideation or panic. No fever, night sweats, wt loss, loss of appetite, or other constitutional symptoms.  Pt states good ability with ADL's, has low fall risk, home safety reviewed and adequate, no other significant changes in hearing or vision, and only occasionally active with exercise.  Wife ill with recent vomiting , hosp with n/v/d and asthma - now improved. Has difficutly with gettting to sleep most nights.  Asks for STD testing as well, has hx of genital herpes, last HIV neg 2009 Past Medical History  Diagnosis Date  . Impaired glucose tolerance   . HYPERLIPIDEMIA   . ALLERGIC RHINITIS   . OSTEOARTHRITIS, HANDS, BILATERAL   . GERD (gastroesophageal reflux disease)    Past Surgical History  Procedure Laterality Date  . Inguinal hernia repair  1995    right  . Cyst excision      neck and leg,drained and packed    reports that he has never smoked. He has never used smokeless tobacco. He reports that he does not drink alcohol or use illicit drugs. family history includes Aneurysm in an other family member; Diabetes in an other family member; Hypertension in an other family member; Hypothyroidism in an other family member; Prostate cancer in his father. There is no history of Colon cancer or Stomach cancer. No Known Allergies / Current Outpatient Prescriptions on File Prior to Visit  Medication Sig  Dispense Refill  . aspirin 81 MG EC tablet Take 1 tablet (81 mg total) by mouth daily. Swallow whole.  30 tablet  12  . aspirin 81 MG tablet Take 81 mg by mouth daily.      . fluticasone (FLONASE) 50 MCG/ACT nasal spray Place 2 sprays into the nose daily.  16 g  2  . ibuprofen (ADVIL,MOTRIN) 800 MG tablet Take 1 tablet (800 mg total) by mouth every 8 (eight) hours as needed for pain.  21 tablet  0  . omeprazole (PRILOSEC) 20 MG capsule TAKE TWO CAPSULES BY MOUTH EVERY DAY  180 capsule  3  . sennosides-docusate sodium (SENOKOT-S) 8.6-50 MG tablet Take 1 tablet by mouth daily.      . simvastatin (ZOCOR) 40 MG tablet Take 1 tablet (40 mg total) by mouth daily.  90 tablet  3  . triamcinolone cream (KENALOG) 0.1 % Apply topically 2 (two) times daily.  30 g  0  . valACYclovir (VALTREX) 1000 MG tablet TAKE ONE TABLET BY MOUTH ONCE DAILY  90 tablet  0   No current facility-administered medications on file prior to visit.   Review of Systems Constitutional: Negative for increased diaphoresis, other activity, appetite or other siginficant weight change  HENT: Negative for worsening hearing loss, ear pain, facial swelling, mouth sores and neck stiffness.   Eyes: Negative for other worsening pain, redness or visual disturbance.  Respiratory: Negative for shortness of breath and wheezing.   Cardiovascular: Negative for chest pain and palpitations.  Gastrointestinal: Negative  for diarrhea, blood in stool, abdominal distention or other pain Genitourinary: Negative for hematuria, flank pain or change in urine volume.  Musculoskeletal: Negative for myalgias or other joint complaints.  Skin: Negative for color change and wound.  Neurological: Negative for syncope and numbness. other than noted Hematological: Negative for adenopathy. or other swelling Psychiatric/Behavioral: Negative for hallucinations, self-injury, decreased concentration or other worsening agitation.      Objective:   Physical Exam BP  160/100  Pulse 87  Temp(Src) 98.3 F (36.8 C) (Oral)  Ht 5' 10.5" (1.791 m)  Wt 203 lb 1.9 oz (92.135 kg)  BMI 28.72 kg/m2  SpO2 97% VS noted,  Constitutional: Pt is oriented to person, place, and time. Appears well-developed and well-nourished.  Head: Normocephalic and atraumatic.  Right Ear: External ear normal.  Left Ear: External ear normal.  Nose: Nose normal.  Mouth/Throat: Oropharynx is clear and moist.  Eyes: Conjunctivae and EOM are normal. Pupils are equal, round, and reactive to light.  Neck: Normal range of motion. Neck supple. No JVD present. No tracheal deviation present.  Cardiovascular: Normal rate, regular rhythm, normal heart sounds and intact distal pulses.   Pulmonary/Chest: Effort normal and breath sounds without rales or wheezing  Abdominal: Soft. Bowel sounds are normal. NT. No HSM  Musculoskeletal: Normal range of motion. Exhibits no edema.  Lymphadenopathy:  Has no cervical adenopathy.  Neurological: Pt is alert and oriented to person, place, and time. Pt has normal reflexes. No cranial nerve deficit. Motor grossly intact Skin: Skin is warm and dry. No rash noted.  Psychiatric:  Has mild nervous mood and affect. Behavior is normal.     Assessment & Plan:

## 2013-11-04 NOTE — Progress Notes (Signed)
Pre visit review using our clinic review tool, if applicable. No additional management support is needed unless otherwise documented below in the visit note. 

## 2013-11-05 DIAGNOSIS — G4700 Insomnia, unspecified: Secondary | ICD-10-CM | POA: Insufficient documentation

## 2013-11-05 LAB — HEPATITIS PANEL, ACUTE
HCV AB: NEGATIVE
HEP B C IGM: NONREACTIVE
HEP B S AG: NEGATIVE
Hep A IgM: NONREACTIVE

## 2013-11-05 LAB — HIV ANTIBODY (ROUTINE TESTING W REFLEX): HIV: NONREACTIVE

## 2013-11-05 LAB — RPR

## 2013-11-05 LAB — GC/CHLAMYDIA PROBE AMP
CT Probe RNA: NEGATIVE
GC Probe RNA: NEGATIVE

## 2013-11-05 NOTE — Assessment & Plan Note (Signed)
stable overall by history and exam, recent data reviewed with pt, and pt to continue medical treatment as before,  to f/u any worsening symptoms or concerns Lab Results  Component Value Date   HGBA1C 6.2* 09/13/2007

## 2013-11-05 NOTE — Assessment & Plan Note (Signed)
For acute hep profile, consider abd [place]- declines for now,  to f/u any worsening symptoms or concerns

## 2013-11-05 NOTE — Assessment & Plan Note (Signed)

## 2013-11-05 NOTE — Assessment & Plan Note (Signed)
For ambien prn,  to f/u any worsening symptoms or concerns

## 2013-11-05 NOTE — Assessment & Plan Note (Signed)
For std testing per pt reqeust

## 2013-11-05 NOTE — Assessment & Plan Note (Signed)
BP Readings from Last 3 Encounters:  11/04/13 160/100  01/26/13 146/100  01/07/13 133/101  Suspect essentiall HTN, pt "rushed" on getting here, declines further tx for now

## 2013-11-07 ENCOUNTER — Other Ambulatory Visit: Payer: Self-pay | Admitting: Internal Medicine

## 2013-11-08 ENCOUNTER — Encounter: Payer: Self-pay | Admitting: Internal Medicine

## 2013-11-20 ENCOUNTER — Other Ambulatory Visit: Payer: Self-pay | Admitting: Internal Medicine

## 2013-12-24 ENCOUNTER — Other Ambulatory Visit: Payer: Self-pay | Admitting: Internal Medicine

## 2014-06-26 ENCOUNTER — Other Ambulatory Visit: Payer: Self-pay | Admitting: Internal Medicine

## 2014-11-10 ENCOUNTER — Encounter: Payer: BC Managed Care – PPO | Admitting: Internal Medicine

## 2014-11-11 ENCOUNTER — Other Ambulatory Visit: Payer: Self-pay | Admitting: Internal Medicine

## 2014-11-23 ENCOUNTER — Telehealth: Payer: Self-pay

## 2014-11-23 ENCOUNTER — Other Ambulatory Visit (INDEPENDENT_AMBULATORY_CARE_PROVIDER_SITE_OTHER): Payer: Self-pay

## 2014-11-23 DIAGNOSIS — Z0000 Encounter for general adult medical examination without abnormal findings: Secondary | ICD-10-CM

## 2014-11-23 LAB — CBC WITH DIFFERENTIAL/PLATELET
BASOS PCT: 0.3 % (ref 0.0–3.0)
Basophils Absolute: 0 10*3/uL (ref 0.0–0.1)
EOS ABS: 0.1 10*3/uL (ref 0.0–0.7)
Eosinophils Relative: 2.2 % (ref 0.0–5.0)
HEMATOCRIT: 42.2 % (ref 39.0–52.0)
Hemoglobin: 14 g/dL (ref 13.0–17.0)
LYMPHS ABS: 2.4 10*3/uL (ref 0.7–4.0)
Lymphocytes Relative: 39.3 % (ref 12.0–46.0)
MCHC: 33.2 g/dL (ref 30.0–36.0)
MCV: 88.7 fl (ref 78.0–100.0)
MONO ABS: 0.4 10*3/uL (ref 0.1–1.0)
Monocytes Relative: 6.8 % (ref 3.0–12.0)
NEUTROS ABS: 3.1 10*3/uL (ref 1.4–7.7)
NEUTROS PCT: 51.4 % (ref 43.0–77.0)
PLATELETS: 196 10*3/uL (ref 150.0–400.0)
RBC: 4.76 Mil/uL (ref 4.22–5.81)
RDW: 13.6 % (ref 11.5–15.5)
WBC: 6.1 10*3/uL (ref 4.0–10.5)

## 2014-11-23 LAB — BASIC METABOLIC PANEL
BUN: 16 mg/dL (ref 6–23)
CALCIUM: 9.5 mg/dL (ref 8.4–10.5)
CO2: 29 meq/L (ref 19–32)
Chloride: 106 mEq/L (ref 96–112)
Creatinine, Ser: 1.25 mg/dL (ref 0.40–1.50)
GFR: 75.42 mL/min (ref >60.00)
Glucose, Bld: 92 mg/dL (ref 70–99)
Potassium: 4.1 mEq/L (ref 3.5–5.1)
SODIUM: 143 meq/L (ref 135–145)

## 2014-11-23 LAB — URINALYSIS, ROUTINE W REFLEX MICROSCOPIC
Bilirubin Urine: NEGATIVE
Ketones, ur: NEGATIVE
Leukocytes, UA: NEGATIVE
Nitrite: NEGATIVE
PH: 6 (ref 5.0–8.0)
Total Protein, Urine: NEGATIVE
Urine Glucose: NEGATIVE
Urobilinogen, UA: 0.2 (ref 0.0–1.0)
WBC UA: NONE SEEN (ref 0–?)

## 2014-11-23 LAB — HEPATIC FUNCTION PANEL
ALBUMIN: 4.5 g/dL (ref 3.5–5.2)
ALT: 33 U/L (ref 0–53)
AST: 27 U/L (ref 0–37)
Alkaline Phosphatase: 39 U/L (ref 39–117)
Bilirubin, Direct: 0.2 mg/dL (ref 0.0–0.3)
TOTAL PROTEIN: 7 g/dL (ref 6.0–8.3)
Total Bilirubin: 1.3 mg/dL — ABNORMAL HIGH (ref 0.2–1.2)

## 2014-11-23 LAB — LIPID PANEL
CHOL/HDL RATIO: 4
CHOLESTEROL: 174 mg/dL (ref 0–200)
HDL: 47.9 mg/dL (ref >39.00)
LDL CALC: 109 mg/dL — AB (ref 0–99)
NonHDL: 126.4
TRIGLYCERIDES: 86 mg/dL (ref 0.0–149.0)
VLDL: 17.2 mg/dL (ref 0.0–40.0)

## 2014-11-23 LAB — TSH: TSH: 2.09 u[IU]/mL (ref 0.35–4.50)

## 2014-11-23 LAB — PSA: PSA: 0.39 ng/mL (ref 0.10–4.00)

## 2014-11-23 NOTE — Telephone Encounter (Signed)
Pt came into [facility] for CPX labs

## 2014-11-28 ENCOUNTER — Ambulatory Visit (INDEPENDENT_AMBULATORY_CARE_PROVIDER_SITE_OTHER): Payer: BLUE CROSS/BLUE SHIELD | Admitting: Internal Medicine

## 2014-11-28 ENCOUNTER — Encounter: Payer: Self-pay | Admitting: Internal Medicine

## 2014-11-28 VITALS — BP 136/94 | HR 73 | Temp 98.4°F | Ht 71.0 in | Wt 210.0 lb

## 2014-11-28 DIAGNOSIS — Z0000 Encounter for general adult medical examination without abnormal findings: Secondary | ICD-10-CM | POA: Diagnosis not present

## 2014-11-28 DIAGNOSIS — Z23 Encounter for immunization: Secondary | ICD-10-CM

## 2014-11-28 MED ORDER — OMEPRAZOLE 20 MG PO CPDR: 20.0000 mg | DELAYED_RELEASE_CAPSULE | Freq: Every day | ORAL | Status: DC

## 2014-11-28 NOTE — Progress Notes (Signed)
Pre visit review using our clinic review tool, if applicable. No additional management support is needed unless otherwise documented below in the visit note. 

## 2014-11-28 NOTE — Assessment & Plan Note (Signed)

## 2014-11-28 NOTE — Patient Instructions (Addendum)
You had the flu shot today  Your EKG was OK today  Your lab work was OK today as well  Please continue all other medications as before, and refills have been done if requested.  Please have the pharmacy call with any other refills you may need.  Please continue your efforts at being more active, low cholesterol diet, and weight control.  You are otherwise up to date with prevention measures today.  Please keep your appointments with your specialists as you may have planned  Please return in 1 year for your yearly visit, or sooner if needed, with Lab testing done 3-5 days before

## 2014-11-28 NOTE — Progress Notes (Signed)
Subjective:    Patient ID: Lance [name], male    DOB: March 16, 1956, 59 y.o.   MRN: [id number]  HPI Here for wellness and f/u;  Overall doing ok;  Pt denies Chest pain, worsening SOB, DOE, wheezing, orthopnea, PND, worsening LE edema, palpitations, dizziness or syncope.  Pt denies neurological change such as new headache, facial or extremity weakness.  Pt denies polydipsia, polyuria, or low sugar symptoms. Pt states overall good compliance with treatment and medications, good tolerability, and has been trying to follow appropriate diet.  Pt denies worsening depressive symptoms, suicidal ideation or panic. No fever, night sweats, wt loss, loss of appetite, or other constitutional symptoms.  Pt states good ability with ADL's, has low fall risk, home safety reviewed and adequate, no other significant changes in hearing or vision, and only occasionally active with exercise. No new complaints. Working full time as Glass blower/designer Past Medical History  Diagnosis Date  . Impaired glucose tolerance   . HYPERLIPIDEMIA   . ALLERGIC RHINITIS   . OSTEOARTHRITIS, HANDS, BILATERAL   . GERD (gastroesophageal reflux disease)    Past Surgical History  Procedure Laterality Date  . Inguinal hernia repair  1995    right  . Cyst excision      neck and leg,drained and packed    reports that he has never smoked. He has never used smokeless tobacco. He reports that he does not drink alcohol or use illicit drugs. family history includes Aneurysm in an other family member; Diabetes in an other family member; Hypertension in an other family member; Hypothyroidism in an other family member; Prostate cancer in his father. There is no history of Colon cancer or Stomach cancer. No Known Allergies Current Outpatient Prescriptions on File Prior to Visit  Medication Sig Dispense Refill  . aspirin 81 MG tablet Take 81 mg by mouth daily.    . sennosides-docusate sodium (SENOKOT-S) 8.6-50 MG tablet Take 1 tablet by mouth  daily.    . simvastatin (ZOCOR) 40 MG tablet TAKE ONE TABLET BY MOUTH ONCE DAILY 90 tablet 3  . valACYclovir (VALTREX) 1000 MG tablet TAKE ONE TABLET BY MOUTH ONCE DAILY 90 tablet 1  . aspirin 81 MG EC tablet Take 1 tablet (81 mg total) by mouth daily. Swallow whole. 30 tablet 12  . fluticasone (FLONASE) 50 MCG/ACT nasal spray Place 2 sprays into the nose daily. (Patient not taking: Reported on 11/28/2014) 16 g 2  . ibuprofen (ADVIL,MOTRIN) 800 MG tablet Take 1 tablet (800 mg total) by mouth every 8 (eight) hours as needed for pain. (Patient not taking: Reported on 11/28/2014) 21 tablet 0  . triamcinolone cream (KENALOG) 0.1 % Apply topically 2 (two) times daily. (Patient not taking: Reported on 11/28/2014) 30 g 0  . zolpidem (AMBIEN) 10 MG tablet Take 1 tablet (10 mg total) by mouth at bedtime as needed for sleep. 30 tablet 3   No current facility-administered medications on file prior to visit.   Review of Systems Constitutional: Negative for increased diaphoresis, other activity, appetite or siginficant weight change other than noted HENT: Negative for worsening hearing loss, ear pain, facial swelling, mouth sores and neck stiffness.   Eyes: Negative for other worsening pain, redness or visual disturbance.  Respiratory: Negative for shortness of breath and wheezing  Cardiovascular: Negative for chest pain and palpitations.  Gastrointestinal: Negative for diarrhea, blood in stool, abdominal distention or other pain Genitourinary: Negative for hematuria, flank pain or change in urine volume.  Musculoskeletal: Negative for myalgias or  other joint complaints.  Skin: Negative for color change and wound or drainage.  Neurological: Negative for syncope and numbness. other than noted Hematological: Negative for adenopathy. or other swelling Psychiatric/Behavioral: Negative for hallucinations, SI, self-injury, decreased concentration or other worsening agitation.      Objective:   Physical Exam BP  136/94 mmHg  Pulse 73  Temp(Src) 98.4 F (36.9 C) (Oral)  Ht 5\' 11"  (1.803 m)  Wt 210 lb (95.255 kg)  BMI 29.30 kg/m2  SpO2 97% VS noted,  Constitutional: Pt is oriented to person, place, and time. Appears well-developed and well-nourished, in no significant distress Head: Normocephalic and atraumatic.  Right Ear: External ear normal.  Left Ear: External ear normal.  Nose: Nose normal.  Mouth/Throat: Oropharynx is clear and moist.  Eyes: Conjunctivae and EOM are normal. Pupils are equal, round, and reactive to light.  Neck: Normal range of motion. Neck supple. No JVD present. No tracheal deviation present or significant neck LA or mass Cardiovascular: Normal rate, regular rhythm, normal heart sounds and intact distal pulses.   Pulmonary/Chest: Effort normal and breath sounds without rales or wheezing  Abdominal: Soft. Bowel sounds are normal. NT. No HSM  Musculoskeletal: Normal range of motion. Exhibits no edema.  Lymphadenopathy:  Has no cervical adenopathy.  Neurological: Pt is alert and oriented to person, place, and time. Pt has normal reflexes. No cranial nerve deficit. Motor grossly intact Skin: Skin is warm and dry. No rash noted.  Psychiatric:  Has normal mood and affect. Behavior is normal.     Assessment & Plan:

## 2014-12-23 ENCOUNTER — Other Ambulatory Visit: Payer: Self-pay | Admitting: Internal Medicine

## 2015-02-09 ENCOUNTER — Other Ambulatory Visit: Payer: Self-pay | Admitting: Internal Medicine

## 2015-07-16 ENCOUNTER — Telehealth: Payer: Self-pay | Admitting: *Deleted

## 2015-07-16 MED ORDER — OMEPRAZOLE 40 MG PO CPDR: 40.0000 mg | DELAYED_RELEASE_CAPSULE | Freq: Every day | ORAL | Status: DC

## 2015-07-16 NOTE — Telephone Encounter (Signed)
Receive call pt states he wasn't aware that md had change the dosage on his omeprazole. He states he always been taking 40 mg, but rx was sent in for 20 mg so he had been taking (2) of the twenties, and now he isneeding refill & pharmacy states its to soon. Pt is wanting his 40 mg script sent back to [facility]. Inform pt will update & send script to pharmacy...Lance [name]

## 2015-09-26 ENCOUNTER — Other Ambulatory Visit: Payer: Self-pay | Admitting: Internal Medicine

## 2015-10-16 ENCOUNTER — Other Ambulatory Visit: Payer: Self-pay | Admitting: *Deleted

## 2015-10-16 MED ORDER — SIMVASTATIN 40 MG PO TABS: 40.0000 mg | ORAL_TABLET | Freq: Every day | ORAL | 0 refills | Status: DC

## 2015-11-22 ENCOUNTER — Other Ambulatory Visit (INDEPENDENT_AMBULATORY_CARE_PROVIDER_SITE_OTHER): Payer: 59

## 2015-11-22 DIAGNOSIS — Z0000 Encounter for general adult medical examination without abnormal findings: Secondary | ICD-10-CM

## 2015-11-22 LAB — CBC WITH DIFFERENTIAL/PLATELET
Basophils Absolute: 0 10*3/uL (ref 0.0–0.1)
Basophils Relative: 0.3 % (ref 0.0–3.0)
EOS PCT: 3 % (ref 0.0–5.0)
Eosinophils Absolute: 0.2 10*3/uL (ref 0.0–0.7)
HCT: 39.5 % (ref 39.0–52.0)
Hemoglobin: 13.1 g/dL (ref 13.0–17.0)
LYMPHS ABS: 2.4 10*3/uL (ref 0.7–4.0)
Lymphocytes Relative: 36.7 % (ref 12.0–46.0)
MCHC: 33.3 g/dL (ref 30.0–36.0)
MCV: 88.8 fl (ref 78.0–100.0)
MONOS PCT: 7.6 % (ref 3.0–12.0)
Monocytes Absolute: 0.5 10*3/uL (ref 0.1–1.0)
NEUTROS ABS: 3.4 10*3/uL (ref 1.4–7.7)
NEUTROS PCT: 52.4 % (ref 43.0–77.0)
PLATELETS: 181 10*3/uL (ref 150.0–400.0)
RBC: 4.44 Mil/uL (ref 4.22–5.81)
RDW: 13.9 % (ref 11.5–15.5)
WBC: 6.4 10*3/uL (ref 4.0–10.5)

## 2015-11-22 LAB — LIPID PANEL
CHOL/HDL RATIO: 3
CHOLESTEROL: 154 mg/dL (ref 0–200)
HDL: 46.8 mg/dL (ref >39.00)
LDL CALC: 87 mg/dL (ref 0–99)
NonHDL: 106.84
Triglycerides: 97 mg/dL (ref 0.0–149.0)
VLDL: 19.4 mg/dL (ref 0.0–40.0)

## 2015-11-22 LAB — URINALYSIS, ROUTINE W REFLEX MICROSCOPIC
Bilirubin Urine: NEGATIVE
Ketones, ur: NEGATIVE
LEUKOCYTES UA: NEGATIVE
Nitrite: NEGATIVE
PH: 6 (ref 5.0–8.0)
Specific Gravity, Urine: >=1.03 — AB (ref 1.000–1.030)
TOTAL PROTEIN, URINE-UPE24: NEGATIVE
Urine Glucose: NEGATIVE
Urobilinogen, UA: 0.2 (ref 0.0–1.0)

## 2015-11-22 LAB — HEPATIC FUNCTION PANEL
ALK PHOS: 35 U/L — AB (ref 39–117)
ALT: 50 U/L (ref 0–53)
AST: 36 U/L (ref 0–37)
Albumin: 4.4 g/dL (ref 3.5–5.2)
BILIRUBIN DIRECT: 0.2 mg/dL (ref 0.0–0.3)
BILIRUBIN TOTAL: 0.9 mg/dL (ref 0.2–1.2)
Total Protein: 6.6 g/dL (ref 6.0–8.3)

## 2015-11-22 LAB — BASIC METABOLIC PANEL
BUN: 12 mg/dL (ref 6–23)
CALCIUM: 9.2 mg/dL (ref 8.4–10.5)
CO2: 32 mEq/L (ref 19–32)
Chloride: 105 mEq/L (ref 96–112)
Creatinine, Ser: 1.14 mg/dL (ref 0.40–1.50)
GFR: 83.61 mL/min (ref >60.00)
Glucose, Bld: 96 mg/dL (ref 70–99)
Potassium: 4.3 mEq/L (ref 3.5–5.1)
SODIUM: 141 meq/L (ref 135–145)

## 2015-11-22 LAB — TSH: TSH: 1.04 u[IU]/mL (ref 0.35–4.50)

## 2015-11-22 LAB — PSA: PSA: 0.41 ng/mL (ref 0.10–4.00)

## 2015-11-30 ENCOUNTER — Ambulatory Visit (INDEPENDENT_AMBULATORY_CARE_PROVIDER_SITE_OTHER): Payer: 59 | Admitting: Internal Medicine

## 2015-11-30 ENCOUNTER — Encounter: Payer: Self-pay | Admitting: Internal Medicine

## 2015-11-30 VITALS — BP 140/80 | HR 61 | Temp 98.1°F | Resp 20 | Wt 212.0 lb

## 2015-11-30 DIAGNOSIS — R030 Elevated blood-pressure reading, without diagnosis of hypertension: Secondary | ICD-10-CM | POA: Diagnosis not present

## 2015-11-30 DIAGNOSIS — E785 Hyperlipidemia, unspecified: Secondary | ICD-10-CM | POA: Diagnosis not present

## 2015-11-30 DIAGNOSIS — Z0001 Encounter for general adult medical examination with abnormal findings: Secondary | ICD-10-CM

## 2015-11-30 DIAGNOSIS — R6889 Other general symptoms and signs: Secondary | ICD-10-CM | POA: Diagnosis not present

## 2015-11-30 DIAGNOSIS — G4710 Hypersomnia, unspecified: Secondary | ICD-10-CM

## 2015-11-30 DIAGNOSIS — R7302 Impaired glucose tolerance (oral): Secondary | ICD-10-CM

## 2015-11-30 MED ORDER — FLUTICASONE PROPIONATE 50 MCG/ACT NA SUSP: 2.0000 | Freq: Every day | NASAL | 11 refills | Status: DC

## 2015-11-30 MED ORDER — OMEPRAZOLE 40 MG PO CPDR: 40.0000 mg | DELAYED_RELEASE_CAPSULE | Freq: Every day | ORAL | 3 refills | Status: DC

## 2015-11-30 MED ORDER — SIMVASTATIN 40 MG PO TABS: 40.0000 mg | ORAL_TABLET | Freq: Every day | ORAL | 3 refills | Status: DC

## 2015-11-30 MED ORDER — ZOLPIDEM TARTRATE 10 MG PO TABS: 10.0000 mg | ORAL_TABLET | Freq: Every evening | ORAL | 3 refills | Status: DC | PRN

## 2015-11-30 NOTE — Assessment & Plan Note (Signed)

## 2015-11-30 NOTE — Progress Notes (Signed)
Subjective:    Patient ID: Lance [name], male    DOB: [birth year], 60 y.o.   MRN: [id number]  HPI Here for wellness and f/u;  Overall doing ok;  Pt denies Chest pain, worsening SOB, DOE, wheezing, orthopnea, PND, worsening LE edema, palpitations, dizziness or syncope.  Pt denies neurological change such as new headache, facial or extremity weakness.  Pt denies polydipsia, polyuria, or low sugar symptoms. Pt states overall good compliance with treatment and medications, good tolerability, and has been trying to follow appropriate diet.  Pt denies worsening depressive symptoms, suicidal ideation or panic. No fever, night sweats, wt loss, loss of appetite, or other constitutional symptoms.  Pt states good ability with ADL's, has low fall risk, home safety reviewed and adequate, no other significant changes in hearing or vision, and only occasionally active with exercise.    Does c/o ongoing fatigue, wife has said he snores, wakes up occas with HA;s, and has some signficant daytime hypersomnolence.  Does have 1-2 times per wk only gets 4-6 hrs sleep, other nights usually 8 hrs.   Past Medical History:  Diagnosis Date  . ALLERGIC RHINITIS   . GERD (gastroesophageal reflux disease)   . HYPERLIPIDEMIA   . Impaired glucose tolerance   . OSTEOARTHRITIS, HANDS, BILATERAL    Past Surgical History:  Procedure Laterality Date  . CYST EXCISION     neck and leg,drained and packed  . [place]   right    reports that he has never smoked. He has never used smokeless tobacco. He reports that he does not drink alcohol or use drugs. family history includes Prostate cancer in his father. No Known Allergies Current Outpatient Prescriptions on File Prior to Visit  Medication Sig Dispense Refill  . aspirin 81 MG tablet Take 81 mg by mouth daily.    . fluticasone (FLONASE) 50 MCG/ACT nasal spray Place 2 sprays into the nose daily. 16 g 2  . ibuprofen (ADVIL,MOTRIN) 800 MG tablet Take 1  tablet (800 mg total) by mouth every 8 (eight) hours as needed for pain. 21 tablet 0  . omeprazole (PRILOSEC) 40 MG capsule Take 1 capsule (40 mg total) by mouth daily. 90 capsule 1  . sennosides-docusate sodium (SENOKOT-S) 8.6-50 MG tablet Take 1 tablet by mouth daily.    . simvastatin (ZOCOR) 40 MG tablet Take 1 tablet (40 mg total) by mouth daily. Keep Sept appt for future refills 90 tablet 0  . triamcinolone cream (KENALOG) 0.1 % Apply topically 2 (two) times daily. 30 g 0  . valACYclovir (VALTREX) 1000 MG tablet TAKE ONE TABLET BY MOUTH ONCE DAILY 90 tablet 0   No current facility-administered medications on file prior to visit.    Review of Systems Constitutional: Negative for increased diaphoresis, or other activity, appetite or siginficant weight change other than noted HENT: Negative for worsening hearing loss, ear pain, facial swelling, mouth sores and neck stiffness.   Eyes: Negative for other worsening pain, redness or visual disturbance.  Respiratory: Negative for choking or stridor Cardiovascular: Negative for other chest pain and palpitations.  Gastrointestinal: Negative for worsening diarrhea, blood in stool, or abdominal distention Genitourinary: Negative for hematuria, flank pain or change in urine volume.  Musculoskeletal: Negative for myalgias or other joint complaints.  Skin: Negative for other color change and wound or drainage.  Neurological: Negative for syncope and numbness. other than noted Hematological: Negative for adenopathy. or other swelling Psychiatric/Behavioral: Negative for hallucinations, SI, self-injury, decreased concentration or other  worsening agitation.      Objective:   Physical Exam BP 140/80   Pulse 61   Temp 98.1 F (36.7 C) (Oral)   Resp 20   Wt 212 lb (96.2 kg)   SpO2 97%   BMI 29.57 kg/m  VS noted,  Constitutional: Pt is oriented to person, place, and time. Appears well-developed and well-nourished, in no significant distress Head:  Normocephalic and atraumatic  Eyes: Conjunctivae and EOM are normal. Pupils are equal, round, and reactive to light Right Ear: External ear normal.  Left Ear: External ear normal Nose: Nose normal.  Mouth/Throat: Oropharynx is clear and moist  Neck: Normal range of motion. Neck supple. No JVD present. No tracheal deviation present or significant neck LA or mass Cardiovascular: Normal rate, regular rhythm, normal heart sounds and intact distal pulses.   Pulmonary/Chest: Effort normal and breath sounds without rales or wheezing  Abdominal: Soft. Bowel sounds are normal. NT. No HSM  Musculoskeletal: Normal range of motion. Exhibits no edema Lymphadenopathy: Has no cervical adenopathy.  Neurological: Pt is alert and oriented to person, place, and time. Pt has normal reflexes. No cranial nerve deficit. Motor grossly intact Skin: Skin is warm and dry. No rash noted or new ulcers Psychiatric:  Has normal mood and affect. Behavior is normal.     Assessment & Plan:

## 2015-11-30 NOTE — Progress Notes (Signed)
Pre visit review using our clinic review tool, if applicable. No additional management support is needed unless otherwise documented below in the visit note. 

## 2015-11-30 NOTE — Patient Instructions (Addendum)
You had the flu shot today  Please continue all other medications as before, and refills have been done if requested.  Please have the pharmacy call with any other refills you may need.  Please continue your efforts at being more active, low cholesterol diet, and weight control.  You are otherwise up to date with prevention measures today.  You will be contacted regarding the referral for: pulmonary  Please keep your appointments with your specialists as you may have planned  Please return in 1 year for your yearly visit, or sooner if needed, with Lab testing done 3-5 days before

## 2015-12-01 NOTE — Assessment & Plan Note (Signed)
stable overall by history and exam, recent data reviewed with pt, and pt to continue medical treatment as before,  to f/u any worsening symptoms or concerns BP Readings from Last 3 Encounters:  11/30/15 140/80  11/28/14 (!) 136/94  11/04/13 (!) 160/100

## 2015-12-01 NOTE — Assessment & Plan Note (Signed)
stable overall by history and exam, recent data reviewed with pt, and pt to continue medical treatment as before,  to f/u any worsening symptoms or concerns Lab Results  Component Value Date   HGBA1C 6.2 (H) 09/13/2007

## 2015-12-01 NOTE — Assessment & Plan Note (Signed)
stable overall by history and exam, recent data reviewed with pt, and pt to continue medical treatment as before,  to f/u any worsening symptoms or concerns Lab Results  Component Value Date   LDLCALC 87 11/22/2015

## 2015-12-01 NOTE — Assessment & Plan Note (Addendum)
With fatigue and hypersomnolence, somewhat irreg sleeping hours, d/w pt good sleep hygeine, for pulm referral r/o osa  In addition to the time spent performing CPE, I spent an additional 25 minutes face to face,in which greater than 50% of this time was spent in counseling and coordination of care for patient's acute illness as documented.

## 2016-01-02 ENCOUNTER — Encounter: Payer: Self-pay | Admitting: Pulmonary Disease

## 2016-01-02 ENCOUNTER — Ambulatory Visit (INDEPENDENT_AMBULATORY_CARE_PROVIDER_SITE_OTHER): Payer: 59 | Admitting: Pulmonary Disease

## 2016-01-02 DIAGNOSIS — G4733 Obstructive sleep apnea (adult) (pediatric): Secondary | ICD-10-CM | POA: Diagnosis not present

## 2016-01-02 NOTE — Assessment & Plan Note (Addendum)
Given excessive daytime somnolence, narrow pharyngeal exam, witnessed apneas & loud snoring, obstructive sleep apnea is very likely & an overnight polysomnogram will be scheduled as a home study. The pathophysiology of obstructive sleep apnea , it's cardiovascular consequences & modes of treatment including CPAP were discused with the patient in detail & they evidenced understanding.  Pretest probability is intermediate  

## 2016-01-02 NOTE — Patient Instructions (Signed)
Home sleep study 

## 2016-01-02 NOTE — Progress Notes (Signed)
Subjective:    Patient ID: Lance [name], male    DOB: 09-04-1955, 60 y.o.   MRN: [id number]  HPI   01/02/2016  Chief Complaint  Patient presents with  . Sleep Consult    referred by Dr. 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 His wife has noted loud snoring and witnessed apneas. He has a 25 minute commute to work, he works 12 hour shifts and he has noted increased sleepiness while driving over the last 2 years. He is not having any driving accidents. He works 7 AM to 7 PM about 4 days a week. Epworth sleepiness score is 13 and he reports sleepiness and tiredness in social situations such as watching TV, as a passenger in a car.  Bedtime is between 10 and 11 PM, sleep latency is minimal, he sleeps on his left side with 2 pillows, reports one to 2 nocturnal awakenings, denies night nocturia and is out of bed by 4:30 AM on workdays feeling tired with occasional dryness of mouth but denies a headache, on weekends he stays in bed until 9 AM and does not feel refreshed  There is no history suggestive of cataplexy, sleep paralysis or parasomnias  He had some problems with nocturnal awakenings and was prescribed Ambien about a year ago. He admits to using this only once or twice a week     Past Medical History:  Diagnosis Date  . ALLERGIC RHINITIS   . GERD (gastroesophageal reflux disease)   . HYPERLIPIDEMIA   . Impaired glucose tolerance   . OSTEOARTHRITIS, HANDS, BILATERAL    Past Surgical History:  Procedure Laterality Date  . CYST EXCISION     neck and leg,drained and packed  . INGUINAL HERNIA REPAIR  1995   right    No Known Allergies   Social History   Social History  . Marital status: Married    Spouse name: N/A  . Number of children: N/A  . Years of education: N/A   Occupational History  . Not on file.   Social History Main Topics  . Smoking status: Never Smoker    . Smokeless tobacco: Never Used  . Alcohol use No  . Drug use: No  . Sexual activity: Not on file   Other Topics Concern  . Not on file   Social History Narrative  . No narrative on file     Family History  Problem Relation Age of Onset  . Hypertension    . Diabetes    . Hypothyroidism    . Aneurysm    . Prostate cancer Father   . Colon cancer Neg Hx   . Stomach cancer Neg Hx      Review of Systems  Constitutional: Negative for activity change, appetite change, chills, fever and unexpected weight change.  HENT: Negative for congestion, dental problem, postnasal drip, rhinorrhea, sneezing, sore throat, trouble swallowing and voice change.   Eyes: Negative for visual disturbance.  Respiratory: Negative for cough, choking and shortness of breath.   Cardiovascular: Negative for chest pain and leg swelling.  Gastrointestinal: Negative for abdominal pain, nausea and vomiting.  Genitourinary: Negative for difficulty urinating.  Musculoskeletal: Negative for arthralgias.  Skin: Negative for rash.  Psychiatric/Behavioral: Negative for behavioral problems and confusion.       Objective:   Physical Exam  Gen. Pleasant, muscular, in no distress, normal affect ENT - no lesions, no post nasal drip, class 2-3 airway  Neck: No JVD, no thyromegaly, no carotid bruits Lungs: no use of accessory muscles, no dullness to percussion, decreased without rales or rhonchi  Cardiovascular: Rhythm regular, heart sounds  normal, no murmurs or gallops, no peripheral edema Abdomen: soft and non-tender, no hepatosplenomegaly, BS normal. Musculoskeletal: No deformities, no cyanosis or clubbing Neuro:  alert, non focal, no tremors       Assessment & Plan:

## 2016-01-07 ENCOUNTER — Other Ambulatory Visit: Payer: Self-pay | Admitting: Internal Medicine

## 2016-01-31 DIAGNOSIS — G4733 Obstructive sleep apnea (adult) (pediatric): Secondary | ICD-10-CM | POA: Diagnosis not present

## 2016-02-04 ENCOUNTER — Telehealth: Payer: Self-pay

## 2016-02-04 NOTE — Telephone Encounter (Signed)
LM[facility] TCB   Dr. Elsworth Soho reviewed this pt. Sleep study and he wanted to inform the pt. That he has severe OSA, 32/h, He needs a CPAP titration study  No orders have been placed at this moment  Since the pt. Has not been able to be reached

## 2016-02-05 DIAGNOSIS — G4733 Obstructive sleep apnea (adult) (pediatric): Secondary | ICD-10-CM | POA: Diagnosis not present

## 2016-02-07 ENCOUNTER — Other Ambulatory Visit: Payer: Self-pay | Admitting: *Deleted

## 2016-02-07 DIAGNOSIS — G4733 Obstructive sleep apnea (adult) (pediatric): Secondary | ICD-10-CM

## 2016-04-10 ENCOUNTER — Other Ambulatory Visit: Payer: Self-pay | Admitting: Internal Medicine

## 2016-07-08 ENCOUNTER — Other Ambulatory Visit: Payer: Self-pay

## 2016-07-08 MED ORDER — VALACYCLOVIR HCL 1 G PO TABS: 1000.0000 mg | ORAL_TABLET | Freq: Every day | ORAL | 5 refills | Status: DC

## 2016-07-08 MED ORDER — OMEPRAZOLE 40 MG PO CPDR: 40.0000 mg | DELAYED_RELEASE_CAPSULE | Freq: Every day | ORAL | 1 refills | Status: DC

## 2016-07-11 ENCOUNTER — Other Ambulatory Visit: Payer: Self-pay

## 2016-07-11 MED ORDER — SIMVASTATIN 40 MG PO TABS: 40.0000 mg | ORAL_TABLET | Freq: Every day | ORAL | 1 refills | Status: DC

## 2016-10-26 ENCOUNTER — Other Ambulatory Visit: Payer: Self-pay | Admitting: Internal Medicine

## 2016-12-12 ENCOUNTER — Other Ambulatory Visit (INDEPENDENT_AMBULATORY_CARE_PROVIDER_SITE_OTHER): Payer: 59

## 2016-12-12 DIAGNOSIS — Z0001 Encounter for general adult medical examination with abnormal findings: Secondary | ICD-10-CM

## 2016-12-12 LAB — CBC WITH DIFFERENTIAL/PLATELET
Basophils Absolute: 0 10*3/uL (ref 0.0–0.1)
Basophils Relative: 0.3 % (ref 0.0–3.0)
Eosinophils Absolute: 0.2 10*3/uL (ref 0.0–0.7)
Eosinophils Relative: 2.8 % (ref 0.0–5.0)
HCT: 43.8 % (ref 39.0–52.0)
Hemoglobin: 14.4 g/dL (ref 13.0–17.0)
Lymphocytes Relative: 41.6 % (ref 12.0–46.0)
Lymphs Abs: 2.6 10*3/uL (ref 0.7–4.0)
MCHC: 32.9 g/dL (ref 30.0–36.0)
MCV: 90 fl (ref 78.0–100.0)
Monocytes Absolute: 0.5 10*3/uL (ref 0.1–1.0)
Monocytes Relative: 7.3 % (ref 3.0–12.0)
Neutro Abs: 3.1 10*3/uL (ref 1.4–7.7)
Neutrophils Relative %: 48 % (ref 43.0–77.0)
Platelets: 175 10*3/uL (ref 150.0–400.0)
RBC: 4.86 Mil/uL (ref 4.22–5.81)
RDW: 13.3 % (ref 11.5–15.5)
WBC: 6.4 10*3/uL (ref 4.0–10.5)

## 2016-12-12 LAB — HEPATIC FUNCTION PANEL
ALT: 56 U/L — ABNORMAL HIGH (ref 0–53)
AST: 36 U/L (ref 0–37)
Albumin: 4.6 g/dL (ref 3.5–5.2)
Alkaline Phosphatase: 37 U/L — ABNORMAL LOW (ref 39–117)
Bilirubin, Direct: 0.2 mg/dL (ref 0.0–0.3)
Total Bilirubin: 1.4 mg/dL — ABNORMAL HIGH (ref 0.2–1.2)
Total Protein: 6.7 g/dL (ref 6.0–8.3)

## 2016-12-12 LAB — LIPID PANEL
CHOLESTEROL: 161 mg/dL (ref 0–200)
HDL: 49.8 mg/dL (ref >39.00)
LDL Cholesterol: 89 mg/dL (ref 0–99)
NONHDL: 111.08
TRIGLYCERIDES: 109 mg/dL (ref 0.0–149.0)
Total CHOL/HDL Ratio: 3
VLDL: 21.8 mg/dL (ref 0.0–40.0)

## 2016-12-12 LAB — URINALYSIS, ROUTINE W REFLEX MICROSCOPIC
BILIRUBIN URINE: NEGATIVE
Hgb urine dipstick: NEGATIVE
KETONES UR: NEGATIVE
LEUKOCYTES UA: NEGATIVE
Nitrite: NEGATIVE
RBC / HPF: NONE SEEN (ref 0–?)
SPECIFIC GRAVITY, URINE: 1.02 (ref 1.000–1.030)
TOTAL PROTEIN, URINE-UPE24: NEGATIVE
UROBILINOGEN UA: 0.2 (ref 0.0–1.0)
Urine Glucose: NEGATIVE
WBC, UA: NONE SEEN (ref 0–?)
pH: 6.5 (ref 5.0–8.0)

## 2016-12-12 LAB — BASIC METABOLIC PANEL
BUN: 12 mg/dL (ref 6–23)
CO2: 33 mEq/L — ABNORMAL HIGH (ref 19–32)
Calcium: 9.7 mg/dL (ref 8.4–10.5)
Chloride: 102 mEq/L (ref 96–112)
Creatinine, Ser: 1.25 mg/dL (ref 0.40–1.50)
GFR: 74.92 mL/min (ref >60.00)
Glucose, Bld: 110 mg/dL — ABNORMAL HIGH (ref 70–99)
Potassium: 4.8 mEq/L (ref 3.5–5.1)
Sodium: 140 mEq/L (ref 135–145)

## 2016-12-12 LAB — PSA: PSA: 0.66 ng/mL (ref 0.10–4.00)

## 2016-12-12 LAB — TSH: TSH: 1.54 u[IU]/mL (ref 0.35–4.50)

## 2016-12-19 ENCOUNTER — Ambulatory Visit (INDEPENDENT_AMBULATORY_CARE_PROVIDER_SITE_OTHER): Payer: BLUE CROSS/BLUE SHIELD | Admitting: Internal Medicine

## 2016-12-19 ENCOUNTER — Encounter: Payer: Self-pay | Admitting: Internal Medicine

## 2016-12-19 VITALS — BP 152/100 | HR 75 | Temp 98.1°F | Ht 70.5 in | Wt 223.0 lb

## 2016-12-19 DIAGNOSIS — J309 Allergic rhinitis, unspecified: Secondary | ICD-10-CM

## 2016-12-19 DIAGNOSIS — Z23 Encounter for immunization: Secondary | ICD-10-CM | POA: Diagnosis not present

## 2016-12-19 DIAGNOSIS — Z0000 Encounter for general adult medical examination without abnormal findings: Secondary | ICD-10-CM | POA: Diagnosis not present

## 2016-12-19 DIAGNOSIS — R739 Hyperglycemia, unspecified: Secondary | ICD-10-CM | POA: Diagnosis not present

## 2016-12-19 MED ORDER — METHYLPREDNISOLONE ACETATE 80 MG/ML IJ SUSP
80.0000 mg | Freq: Once | INTRAMUSCULAR | Status: AC
  Administered 2016-12-19: 80 mg via INTRAMUSCULAR

## 2016-12-19 MED ORDER — TRIAMCINOLONE ACETONIDE 55 MCG/ACT NA AERO: 2.0000 | INHALATION_SPRAY | Freq: Every day | NASAL | 12 refills | Status: DC

## 2016-12-19 MED ORDER — CETIRIZINE HCL 10 MG PO TABS: 10.0000 mg | ORAL_TABLET | Freq: Every day | ORAL | 11 refills | Status: DC

## 2016-12-19 MED ORDER — PREDNISONE 10 MG PO TABS: ORAL_TABLET | ORAL | 0 refills | Status: DC

## 2016-12-19 NOTE — Assessment & Plan Note (Signed)

## 2016-12-19 NOTE — Progress Notes (Signed)
Subjective:    Patient ID: Lance [name], male    DOB: 1955/12/06, 61 y.o.   MRN: [id number]  HPI  Here for wellness and f/u;  Overall doing ok;  Pt denies Chest pain, worsening SOB, DOE, wheezing, orthopnea, PND, worsening LE edema, palpitations, dizziness or syncope.  Pt denies neurological change such as new headache, facial or extremity weakness.  Pt denies polydipsia, polyuria, or low sugar symptoms. Pt states overall good compliance with treatment and medications, good tolerability, and has been trying to follow appropriate diet.  Pt denies worsening depressive symptoms, suicidal ideation or panic. No fever, night sweats, wt loss, loss of appetite, or other constitutional symptoms.  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  Plans to work to 70 If able.  He asks about the shingrix.  Does have several wks ongoing nasal allergy symptoms with clearish congestion, itch and sneezing, without fever, pain, ST, cough, swelling or wheezing. Past Medical History:  Diagnosis Date  . ALLERGIC RHINITIS   . GERD (gastroesophageal reflux disease)   . HYPERLIPIDEMIA   . Impaired glucose tolerance   . OSTEOARTHRITIS, HANDS, BILATERAL    Past Surgical History:  Procedure Laterality Date  . CYST EXCISION     neck and leg,drained and packed  . [place]   right    reports that he has never smoked. He has never used smokeless tobacco. He reports that he does not drink alcohol or use drugs. family history includes Aneurysm in his unknown relative; Diabetes in his unknown relative; Hypertension in his unknown relative; Hypothyroidism in his unknown relative; Prostate cancer in his father. No Known Allergies Current Outpatient Prescriptions on File Prior to Visit  Medication Sig Dispense Refill    . aspirin 81 MG tablet Take 81 mg by mouth daily.    . fluticasone (FLONASE) 50 MCG/ACT nasal spray Place 2 sprays into both nostrils daily. 16 g 11  . ibuprofen (ADVIL,MOTRIN) 800 MG tablet Take 1 tablet (800 mg total) by mouth every 8 (eight) hours as needed for pain. 21 tablet 0  . omeprazole (PRILOSEC) 40 MG capsule Take 1 capsule (40 mg total) by mouth daily. 90 capsule 1  . sennosides-docusate sodium (SENOKOT-S) 8.6-50 MG tablet Take 1 tablet by mouth daily.    . simvastatin (ZOCOR) 40 MG tablet Take 1 tablet (40 mg total) by mouth daily. Keep Sept appt for future refills 90 tablet 1  . triamcinolone cream (KENALOG) 0.1 % Apply topically 2 (two) times daily. 30 g 0  . valACYclovir (VALTREX) 1000 MG tablet Take 1 tablet (1,000 mg total) by mouth daily. 90 tablet 5  . zolpidem (AMBIEN) 10 MG tablet Take 1 tablet (10 mg total) by mouth at bedtime as needed for sleep. 30 tablet 3   No current facility-administered medications on file prior to visit.    Review of Systems Constitutional: Negative for other unusual diaphoresis, sweats, appetite or weight changes HENT: Negative for other worsening hearing loss, ear pain, facial swelling, mouth sores or neck stiffness.   Eyes: Negative for other worsening pain, redness or other visual disturbance.  Respiratory: Negative for other stridor or swelling Cardiovascular: Negative for other palpitations or other chest pain  Gastrointestinal: Negative for worsening diarrhea or loose stools, blood in stool, distention or other pain Genitourinary: Negative  for hematuria, flank pain or other change in urine volume.  Musculoskeletal: Negative for myalgias or other joint swelling.  Skin: Negative for other color change, or other wound or worsening drainage.  Neurological: Negative for other syncope or numbness. Hematological: Negative for other adenopathy or swelling Psychiatric/Behavioral: Negative for hallucinations, other worsening agitation, SI,  self-injury, or new decreased concentration All other system neg per pt    Objective:   Physical Exam BP (!) 152/100   Pulse 75   Temp 98.1 F (36.7 C) (Oral)   Ht 5' 10.5" (1.791 m)   Wt 223 lb (101.2 kg)   SpO2 98%   BMI 31.54 kg/m  VS noted,  Constitutional: Pt is oriented to person, place, and time. Appears well-developed and well-nourished, in no significant distress and comfortable Head: Normocephalic and atraumatic  Eyes: Conjunctivae and EOM are normal. Pupils are equal, round, and reactive to light Bilat tm's with mild erythema.  Max sinus areas non tender.  Pharynx with mild erythema, no exudate Right Ear: External ear normal without discharge Left Ear: External ear normal without discharge Nose: Nose without discharge or deformity Mouth/Throat: Oropharynx is without other ulcerations and moist  Neck: Normal range of motion. Neck supple. No JVD present. No tracheal deviation present or significant neck LA or mass Cardiovascular: Normal rate, regular rhythm, normal heart sounds and intact distal pulses.   Pulmonary/Chest: WOB normal and breath sounds without rales or wheezing  Abdominal: Soft. Bowel sounds are normal. NT. No HSM  Musculoskeletal: Normal range of motion. Exhibits no edema Lymphadenopathy: Has no other cervical adenopathy.  Neurological: Pt is alert and oriented to person, place, and time. Pt has normal reflexes. No cranial nerve deficit. Motor grossly intact, Gait intact Skin: Skin is warm and dry. No rash noted or new ulcerations Psychiatric:  Has normal mood and affect. Behavior is normal without agitation No other exam findings  Lab Results  Component Value Date   WBC 6.4 12/12/2016   HGB 14.4 12/12/2016   HCT 43.8 12/12/2016   PLT 175.0 12/12/2016   GLUCOSE 110 (H) 12/12/2016   CHOL 161 12/12/2016   TRIG 109.0 12/12/2016   HDL 49.80 12/12/2016   LDLDIRECT 160.3 05/11/2009   LDLCALC 89 12/12/2016   ALT 56 (H) 12/12/2016   AST 36 12/12/2016    NA 140 12/12/2016   K 4.8 12/12/2016   CL 102 12/12/2016   CREATININE 1.25 12/12/2016   BUN 12 12/12/2016   CO2 33 (H) 12/12/2016   TSH 1.54 12/12/2016   PSA 0.66 12/12/2016   HGBA1C 6.2 (H) 09/13/2007       Assessment & Plan:

## 2016-12-19 NOTE — Assessment & Plan Note (Signed)
Mild to mod, for depomedrol IM 80, predpac, zyrtec and nasacort asd,  to f/u any worsening symptoms or concerns

## 2016-12-19 NOTE — Patient Instructions (Signed)
You had the flu shot today, and You had the steroid shot today  Please take all new medication as prescribed - the prednisone, zyrtec and nasacort  Please continue all other medications as before, and refills have been done if requested.  Please have the pharmacy call with any other refills you may need.  Please continue your efforts at being more active, low cholesterol diet, and weight control.  You are otherwise up to date with prevention measures today.  Please keep your appointments with your specialists as you may have planned  Your lab work was very good.  Please return in 1 year for your yearly visit, or sooner if needed, with Lab testing done 3-5 days before

## 2016-12-28 ENCOUNTER — Other Ambulatory Visit: Payer: Self-pay | Admitting: Internal Medicine

## 2017-01-28 ENCOUNTER — Other Ambulatory Visit: Payer: Self-pay | Admitting: Internal Medicine

## 2017-04-03 ENCOUNTER — Other Ambulatory Visit: Payer: Self-pay | Admitting: Internal Medicine

## 2017-10-13 ENCOUNTER — Encounter: Payer: Self-pay | Admitting: *Deleted

## 2017-10-30 ENCOUNTER — Encounter: Payer: Self-pay | Admitting: Internal Medicine

## 2017-12-25 ENCOUNTER — Encounter: Payer: BLUE CROSS/BLUE SHIELD | Admitting: Internal Medicine

## 2018-01-01 ENCOUNTER — Other Ambulatory Visit (INDEPENDENT_AMBULATORY_CARE_PROVIDER_SITE_OTHER): Payer: BLUE CROSS/BLUE SHIELD

## 2018-01-01 DIAGNOSIS — R739 Hyperglycemia, unspecified: Secondary | ICD-10-CM | POA: Diagnosis not present

## 2018-01-01 DIAGNOSIS — Z0000 Encounter for general adult medical examination without abnormal findings: Secondary | ICD-10-CM | POA: Diagnosis not present

## 2018-01-01 LAB — HEPATIC FUNCTION PANEL
ALT: 51 U/L (ref 0–53)
AST: 25 U/L (ref 0–37)
Albumin: 4.7 g/dL (ref 3.5–5.2)
Alkaline Phosphatase: 42 U/L (ref 39–117)
BILIRUBIN DIRECT: 0.2 mg/dL (ref 0.0–0.3)
BILIRUBIN TOTAL: 1.6 mg/dL — AB (ref 0.2–1.2)
TOTAL PROTEIN: 7.1 g/dL (ref 6.0–8.3)

## 2018-01-01 LAB — BASIC METABOLIC PANEL
BUN: 15 mg/dL (ref 6–23)
CHLORIDE: 103 meq/L (ref 96–112)
CO2: 31 mEq/L (ref 19–32)
Calcium: 9.7 mg/dL (ref 8.4–10.5)
Creatinine, Ser: 1.19 mg/dL (ref 0.40–1.50)
GFR: 79.03 mL/min (ref >60.00)
Glucose, Bld: 110 mg/dL — ABNORMAL HIGH (ref 70–99)
POTASSIUM: 4 meq/L (ref 3.5–5.1)
SODIUM: 140 meq/L (ref 135–145)

## 2018-01-01 LAB — CBC WITH DIFFERENTIAL/PLATELET
BASOS PCT: 0.7 % (ref 0.0–3.0)
Basophils Absolute: 0 10*3/uL (ref 0.0–0.1)
EOS ABS: 0.2 10*3/uL (ref 0.0–0.7)
EOS PCT: 3.2 % (ref 0.0–5.0)
HCT: 42.9 % (ref 39.0–52.0)
Hemoglobin: 14.2 g/dL (ref 13.0–17.0)
Lymphocytes Relative: 36.2 % (ref 12.0–46.0)
Lymphs Abs: 2.3 10*3/uL (ref 0.7–4.0)
MCHC: 33.1 g/dL (ref 30.0–36.0)
MCV: 88.7 fl (ref 78.0–100.0)
MONO ABS: 0.5 10*3/uL (ref 0.1–1.0)
Monocytes Relative: 7 % (ref 3.0–12.0)
NEUTROS ABS: 3.4 10*3/uL (ref 1.4–7.7)
Neutrophils Relative %: 52.9 % (ref 43.0–77.0)
PLATELETS: 176 10*3/uL (ref 150.0–400.0)
RBC: 4.83 Mil/uL (ref 4.22–5.81)
RDW: 13.1 % (ref 11.5–15.5)
WBC: 6.4 10*3/uL (ref 4.0–10.5)

## 2018-01-01 LAB — URINALYSIS, ROUTINE W REFLEX MICROSCOPIC
BILIRUBIN URINE: NEGATIVE
KETONES UR: NEGATIVE
Leukocytes, UA: NEGATIVE
Nitrite: NEGATIVE
PH: 6 (ref 5.0–8.0)
Specific Gravity, Urine: 1.02 (ref 1.000–1.030)
TOTAL PROTEIN, URINE-UPE24: NEGATIVE
UROBILINOGEN UA: 0.2 (ref 0.0–1.0)
Urine Glucose: NEGATIVE

## 2018-01-01 LAB — LIPID PANEL
CHOLESTEROL: 165 mg/dL (ref 0–200)
HDL: 43.9 mg/dL (ref >39.00)
LDL Cholesterol: 87 mg/dL (ref 0–99)
NONHDL: 121.5
Total CHOL/HDL Ratio: 4
Triglycerides: 175 mg/dL — ABNORMAL HIGH (ref 0.0–149.0)
VLDL: 35 mg/dL (ref 0.0–40.0)

## 2018-01-01 LAB — HEMOGLOBIN A1C: HEMOGLOBIN A1C: 6.6 % — AB (ref 4.6–6.5)

## 2018-01-01 LAB — TSH: TSH: 2.14 u[IU]/mL (ref 0.35–4.50)

## 2018-01-01 LAB — PSA: PSA: 0.8 ng/mL (ref 0.10–4.00)

## 2018-01-08 ENCOUNTER — Encounter: Payer: Self-pay | Admitting: Internal Medicine

## 2018-01-08 ENCOUNTER — Other Ambulatory Visit: Payer: Self-pay | Admitting: Internal Medicine

## 2018-01-08 ENCOUNTER — Ambulatory Visit (INDEPENDENT_AMBULATORY_CARE_PROVIDER_SITE_OTHER): Payer: BLUE CROSS/BLUE SHIELD | Admitting: Internal Medicine

## 2018-01-08 VITALS — BP 124/86 | HR 65 | Temp 97.6°F | Ht 70.5 in | Wt 221.0 lb

## 2018-01-08 DIAGNOSIS — R7302 Impaired glucose tolerance (oral): Secondary | ICD-10-CM

## 2018-01-08 DIAGNOSIS — Z0000 Encounter for general adult medical examination without abnormal findings: Secondary | ICD-10-CM | POA: Diagnosis not present

## 2018-01-08 DIAGNOSIS — M545 Low back pain, unspecified: Secondary | ICD-10-CM

## 2018-01-08 DIAGNOSIS — Z23 Encounter for immunization: Secondary | ICD-10-CM | POA: Diagnosis not present

## 2018-01-08 NOTE — Patient Instructions (Addendum)
You had the flu shot today  Please continue all other medications as before, and refills have been done if requested.  Please have the pharmacy call with any other refills you may need.  Please continue your efforts at being more active, low cholesterol diet, and weight control.  You are otherwise up to date with prevention measures today.  Please keep your appointments with your specialists as you may have planned  You will be contacted regarding the referral for: colonoscopy  Please return in 1 year for your yearly visit, or sooner if needed, with Lab testing done 3-5 days before

## 2018-01-08 NOTE — Progress Notes (Signed)
Subjective:    Patient ID: Lance [name], male    DOB: Jan 26, 1956, 62 y.o.   MRN: [id number]  HPI  Here for wellness and f/u;  Overall doing ok;  Pt denies Chest pain, worsening SOB, DOE, wheezing, orthopnea, PND, worsening LE edema, palpitations, dizziness or syncope.  Pt denies neurological change such as new headache, facial or extremity weakness.  Pt denies polydipsia, polyuria, or low sugar symptoms. Pt states overall good compliance with treatment and medications, good tolerability, and has been trying to follow appropriate diet.  Pt denies worsening depressive symptoms, suicidal ideation or panic. No fever, night sweats, wt loss, loss of appetite, or other constitutional symptoms.  Pt states good ability with ADL's, has low fall risk, home safety reviewed and adequate, no other significant changes in hearing or vision, and only occasionally active with exercise. Also, Pt continues to have recurring right LBP without change in severity, bowel or bladder change, fever, wt loss,  worsening LE pain/numbness/weakness, gait change or falls. Past Medical History:  Diagnosis Date  . ALLERGIC RHINITIS   . GERD (gastroesophageal reflux disease)   . HYPERLIPIDEMIA   . Impaired glucose tolerance   . OSTEOARTHRITIS, HANDS, BILATERAL    Past Surgical History:  Procedure Laterality Date  . CYST EXCISION     neck and leg,drained and packed  . [place]   right    reports that he has never smoked. He has never used smokeless tobacco. He reports that he does not drink alcohol or use drugs. family history includes Aneurysm in his unknown relative; Diabetes in his unknown relative; Hypertension in his unknown relative; Hypothyroidism in his unknown relative; Prostate cancer in his father. No Known Allergies Current Outpatient Medications on File Prior to Visit  Medication Sig Dispense Refill  . aspirin 81 MG tablet Take 81 mg by mouth daily.    Marland Kitchen ibuprofen (ADVIL,MOTRIN) 800 MG  tablet Take 1 tablet (800 mg total) by mouth every 8 (eight) hours as needed for pain. 21 tablet 0  . omeprazole (PRILOSEC) 40 MG capsule Take 1 capsule (40 mg total) by mouth daily. 90 capsule 1  . predniSONE (DELTASONE) 10 MG tablet 3 tabs by mouth per day for 3 days,2tabs per day for 3 days,1tab per day for 3 days 18 tablet 0  . sennosides-docusate sodium (SENOKOT-S) 8.6-50 MG tablet Take 1 tablet by mouth daily.    . simvastatin (ZOCOR) 40 MG tablet Take 1 tablet (40 mg total) by mouth daily. Keep Sept appt for future refills 90 tablet 1  . triamcinolone (NASACORT) 55 MCG/ACT AERO nasal inhaler Place 2 sprays into the nose daily. 1 Inhaler 12  . triamcinolone cream (KENALOG) 0.1 % Apply topically 2 (two) times daily. 30 g 0  . valACYclovir (VALTREX) 1000 MG tablet Take 1 tablet (1,000 mg total) by mouth daily. 90 tablet 5  . zolpidem (AMBIEN) 10 MG tablet Take 1 tablet (10 mg total) by mouth at bedtime as needed for sleep. 30 tablet 3   No current facility-administered medications on file prior to visit.    Review of Systems Constitutional: Negative for other unusual diaphoresis, sweats, appetite or weight changes HENT: Negative for other worsening hearing loss, ear pain, facial swelling, mouth sores or neck stiffness.   Eyes: Negative for other worsening pain, redness or other visual disturbance.  Respiratory: Negative for other stridor or swelling Cardiovascular: Negative for other palpitations or other chest pain  Gastrointestinal: Negative for worsening diarrhea or loose stools,  blood in stool, distention or other pain Genitourinary: Negative for hematuria, flank pain or other change in urine volume.  Musculoskeletal: Negative for myalgias or other joint swelling.  Skin: Negative for other color change, or other wound or worsening drainage.  Neurological: Negative for other syncope or numbness. Hematological: Negative for other adenopathy or swelling Psychiatric/Behavioral: Negative  for hallucinations, other worsening agitation, SI, self-injury, or new decreased concentration All other system neg per pt    Objective:   Physical Exam BP 124/86   Pulse 65   Temp 97.6 F (36.4 C) (Oral)   Ht 5' 10.5" (1.791 m)   Wt 221 lb (100.2 kg)   SpO2 96%   BMI 31.26 kg/m  VS noted,  Constitutional: Pt is oriented to person, place, and time. Appears well-developed and well-nourished, in no significant distress and comfortable Head: Normocephalic and atraumatic  Eyes: Conjunctivae and EOM are normal. Pupils are equal, round, and reactive to light Right Ear: External ear normal without discharge Left Ear: External ear normal without discharge Nose: Nose without discharge or deformity Mouth/Throat: Oropharynx is without other ulcerations and moist  Neck: Normal range of motion. Neck supple. No JVD present. No tracheal deviation present or significant neck LA or mass Cardiovascular: Normal rate, regular rhythm, normal heart sounds and intact distal pulses.   Pulmonary/Chest: WOB normal and breath sounds without rales or wheezing  Abdominal: Soft. Bowel sounds are normal. NT. No HSM  Musculoskeletal: Normal range of motion. Exhibits no edema, has mild right lumbar paravertebral tender spasm Lymphadenopathy: Has no other cervical adenopathy.  Neurological: Pt is alert and oriented to person, place, and time. Pt has normal reflexes. No cranial nerve deficit. Motor grossly intact, Gait intact Skin: Skin is warm and dry. No rash noted or new ulcerations Psychiatric:  Has normal mood and affect. Behavior is normal without agitation No other exam findings Lab Results  Component Value Date   WBC 6.4 01/01/2018   HGB 14.2 01/01/2018   HCT 42.9 01/01/2018   PLT 176.0 01/01/2018   GLUCOSE 110 (H) 01/01/2018   CHOL 165 01/01/2018   TRIG 175.0 (H) 01/01/2018   HDL 43.90 01/01/2018   LDLDIRECT 160.3 05/11/2009   LDLCALC 87 01/01/2018   ALT 51 01/01/2018   AST 25 01/01/2018   NA 140  01/01/2018   K 4.0 01/01/2018   CL 103 01/01/2018   CREATININE 1.19 01/01/2018   BUN 15 01/01/2018   CO2 31 01/01/2018   TSH 2.14 01/01/2018   PSA 0.80 01/01/2018   HGBA1C 6.6 (H) 01/01/2018      Assessment & Plan:

## 2018-01-09 DIAGNOSIS — M545 Low back pain, unspecified: Secondary | ICD-10-CM | POA: Insufficient documentation

## 2018-01-09 NOTE — Assessment & Plan Note (Signed)
C/w msk strain recurrent, for flexeril 5 prn

## 2018-01-09 NOTE — Assessment & Plan Note (Signed)

## 2018-01-09 NOTE — Assessment & Plan Note (Signed)
stable overall by history and exam, recent data reviewed with pt, and pt to continue medical treatment as before,  to f/u any worsening symptoms or concerns  

## 2018-01-11 ENCOUNTER — Encounter: Payer: Self-pay | Admitting: Internal Medicine

## 2018-01-12 ENCOUNTER — Telehealth: Payer: Self-pay | Admitting: Internal Medicine

## 2018-01-12 NOTE — Telephone Encounter (Signed)
Copied from [place] [id number]. Topic: Quick Communication - Rx Refill/Question >> Jan 12, 2018  2:32 PM Judyann Munson wrote: Medication: valACYclovir (VALTREX) 1000 MG tablet   Has the patient contacted their pharmacy?yes  Preferred Pharmacy (with phone number or street name): [facility] ([address] [place] [phone] (Phone) [phone] (Fax)    Agent: Please be advised that RX refills may take up to 3 business days. We ask that you follow-up with your pharmacy.

## 2018-01-13 ENCOUNTER — Telehealth: Payer: Self-pay

## 2018-01-13 MED ORDER — VALACYCLOVIR HCL 1 G PO TABS: 1000.0000 mg | ORAL_TABLET | Freq: Every day | ORAL | 1 refills | Status: DC

## 2018-01-13 MED ORDER — CETIRIZINE HCL 10 MG PO TABS: 10.0000 mg | ORAL_TABLET | Freq: Every day | ORAL | 5 refills | Status: DC

## 2018-01-13 NOTE — Telephone Encounter (Signed)
Med refill sent

## 2018-02-12 ENCOUNTER — Ambulatory Visit (AMBULATORY_SURGERY_CENTER): Payer: Self-pay | Admitting: *Deleted

## 2018-02-12 VITALS — Ht 70.5 in | Wt 219.0 lb

## 2018-02-12 DIAGNOSIS — Z86010 Personal history of colonic polyps: Secondary | ICD-10-CM

## 2018-02-12 MED ORDER — NA SULFATE-K SULFATE-MG SULF 17.5-3.13-1.6 GM/177ML PO SOLN: 1.0000 | Freq: Once | ORAL | 0 refills | Status: AC

## 2018-02-12 NOTE — Progress Notes (Signed)
No egg or soy allergy known to patient  No issues with past sedation with any surgeries  or procedures, no intubation problems  No diet pills per patient No home 02 use per patient  No blood thinners per patient  Pt denies issues with constipation  No A fib or A flutter  EMMI video sent to pt's e mail  suprep PNM $50 coupon

## 2018-02-17 ENCOUNTER — Encounter: Payer: Self-pay | Admitting: Internal Medicine

## 2018-03-05 ENCOUNTER — Ambulatory Visit (AMBULATORY_SURGERY_CENTER): Payer: BLUE CROSS/BLUE SHIELD | Admitting: Internal Medicine

## 2018-03-05 ENCOUNTER — Encounter: Payer: Self-pay | Admitting: Internal Medicine

## 2018-03-05 VITALS — BP 117/72 | HR 74 | Temp 99.6°F | Resp 14 | Ht 70.5 in | Wt 219.0 lb

## 2018-03-05 DIAGNOSIS — D120 Benign neoplasm of cecum: Secondary | ICD-10-CM

## 2018-03-05 DIAGNOSIS — Z86010 Personal history of colonic polyps: Secondary | ICD-10-CM | POA: Diagnosis not present

## 2018-03-05 DIAGNOSIS — D124 Benign neoplasm of descending colon: Secondary | ICD-10-CM

## 2018-03-05 DIAGNOSIS — D122 Benign neoplasm of ascending colon: Secondary | ICD-10-CM

## 2018-03-05 MED ORDER — SODIUM CHLORIDE 0.9 % IV SOLN: 500.0000 mL | Freq: Once | INTRAVENOUS | Status: DC

## 2018-03-05 NOTE — Progress Notes (Signed)
To recovery, report to RN, VSS. 

## 2018-03-05 NOTE — Patient Instructions (Signed)
Impression/Recommendations:  Polyp handout given to patient. Diverticulosis handout given to patient.  Resume previous diet. Continue present medications.  Repeat colonoscopy recommended for surveillance.  Date to be determined after pathology results reviewed.  YOU HAD AN ENDOSCOPIC PROCEDURE TODAY AT THE [facility]:   Refer to the procedure report that was given to you for any specific questions about what was found during the examination.  If the procedure report does not answer your questions, please call your gastroenterologist to clarify.  If you requested that your care partner not be given the details of your procedure findings, then the procedure report has been included in a sealed envelope for you to review at your convenience later.  YOU SHOULD EXPECT: Some feelings of bloating in the abdomen. Passage of more gas than usual.  Walking can help get rid of the air that was put into your GI tract during the procedure and reduce the bloating. If you had a lower endoscopy (such as a colonoscopy or flexible sigmoidoscopy) you may notice spotting of blood in your stool or on the toilet paper. If you underwent a bowel prep for your procedure, you may not have a normal bowel movement for a few days.  Please Note:  You might notice some irritation and congestion in your nose or some drainage.  This is from the oxygen used during your procedure.  There is no need for concern and it should clear up in a day or so.  SYMPTOMS TO REPORT IMMEDIATELY:   Following lower endoscopy (colonoscopy or flexible sigmoidoscopy):  Excessive amounts of blood in the stool  Significant tenderness or worsening of abdominal pains  Swelling of the abdomen that is new, acute  Fever of 100F or higher  For urgent or emergent issues, a gastroenterologist can be reached at any hour by calling [phone].   DIET:  We do recommend a small meal at first, but then you may proceed to your regular diet.   Drink plenty of fluids but you should avoid alcoholic beverages for 24 hours.  ACTIVITY:  You should plan to take it easy for the rest of today and you should NOT DRIVE or use heavy machinery until tomorrow (because of the sedation medicines used during the test).    FOLLOW UP: Our staff will call the number listed on your records the next business day following your procedure to check on you and address any questions or concerns that you may have regarding the information given to you following your procedure. If we do not reach you, we will leave a message.  However, if you are feeling well and you are not experiencing any problems, there is no need to return our call.  We will assume that you have returned to your regular daily activities without incident.  If any biopsies were taken you will be contacted by phone or by letter within the next 1-3 weeks.  Please call us at [phone] if you have not heard about the biopsies in 3 weeks.    SIGNATURES/CONFIDENTIALITY: You and/or your care partner have signed paperwork which will be entered into your electronic medical record.  These signatures attest to the fact that that the information above on your After Visit Summary has been reviewed and is understood.  Full responsibility of the confidentiality of this discharge information lies with you and/or your care-partner. 

## 2018-03-05 NOTE — Op Note (Signed)
[place] Patient Name: Lance [name] Procedure Date: 03/05/2018 10:55 AM MRN: [id number] Endoscopist: Jerene Bears , MD Age: 62 Referring MD:  Date of Birth: [birth year] Gender: Male Account #: [id number] Procedure:                Colonoscopy Indications:              Surveillance: Personal history of adenomatous                            polyps on last colonoscopy 5 years ago Medicines:                Monitored Anesthesia Care Procedure:                Pre-Anesthesia Assessment:                           - Prior to the procedure, a History and Physical                            was performed, and patient medications and                            allergies were reviewed. The patient's tolerance of                            previous anesthesia was also reviewed. The risks                            and benefits of the procedure and the sedation                            options and risks were discussed with the patient.                            All questions were answered, and informed consent                            was obtained. Prior Anticoagulants: The patient has                            taken no previous anticoagulant or antiplatelet                            agents. ASA Grade Assessment: II - A patient with                            mild systemic disease. After reviewing the risks                            and benefits, the patient was deemed in                            satisfactory condition to undergo the procedure.  After obtaining informed consent, the colonoscope                            was passed under direct vision. Throughout the                            procedure, the patient's blood pressure, pulse, and                            oxygen saturations were monitored continuously. The                            Colonoscope was introduced through the anus and                            advanced to the cecum,  identified by appendiceal                            orifice and ileocecal valve. The colonoscopy was                            performed without difficulty. The patient tolerated                            the procedure well. The quality of the bowel                            preparation was good. The ileocecal valve,                            appendiceal orifice, and rectum were photographed. Scope In: 11:02:23 AM Scope Out: 11:17:19 AM Scope Withdrawal Time: 0 hours 13 minutes 45 seconds  Total Procedure Duration: 0 hours 14 minutes 56 seconds  Findings:                 The digital rectal exam was normal.                           A 3 mm polyp was found in the cecum. The polyp was                            sessile. The polyp was removed with a cold snare.                            Resection and retrieval were complete.                           A 8 mm polyp was found in the ascending colon. The                            polyp was sessile. The polyp was removed with a  cold snare. Resection and retrieval were complete.                           A 4 mm polyp was found in the descending colon. The                            polyp was sessile. The polyp was removed with a                            cold snare. Resection and retrieval were complete.                           Multiple small and large-mouthed diverticula were                            found in the sigmoid colon, descending colon and                            ascending colon.                           A diffuse area of mild melanosis was found in the                            entire colon.                           The retroflexed view of the distal rectum and anal                            verge was normal and showed no anal or rectal                            abnormalities. Complications:            No immediate complications. Estimated Blood Loss:     Estimated blood loss was  minimal. Impression:               - One 3 mm polyp in the cecum, removed with a cold                            snare. Resected and retrieved.                           - One 8 mm polyp in the ascending colon, removed                            with a cold snare. Resected and retrieved.                           - One 4 mm polyp in the descending colon, removed                            with a cold snare. Resected and retrieved.                           -  Diverticulosis in the sigmoid colon, in the                            descending colon and in the ascending colon.                           - Melanosis in the colon.                           - The distal rectum and anal verge are normal on                            retroflexion view. Recommendation:           - Patient has a contact number available for                            emergencies. The signs and symptoms of potential                            delayed complications were discussed with the                            patient. Return to normal activities tomorrow.                            Written discharge instructions were provided to the                            patient.                           - Resume previous diet.                           - Continue present medications.                           - Await pathology results.                           - Repeat colonoscopy is recommended for                            surveillance. The colonoscopy date will be                            determined after pathology results from today's                            exam become available for review. Jerene Bears, MD 03/05/2018 11:23:46 AM This report has been signed electronically.

## 2018-03-05 NOTE — Progress Notes (Signed)
Pt's states no medical or surgical changes since previsit or office visit. 

## 2018-03-05 NOTE — Progress Notes (Signed)
Called to room to assist during endoscopic procedure.  Patient ID and intended procedure confirmed with present staff. Received instructions for my participation in the procedure from the performing physician.  

## 2018-03-08 ENCOUNTER — Telehealth: Payer: Self-pay | Admitting: *Deleted

## 2018-03-08 ENCOUNTER — Telehealth: Payer: Self-pay

## 2018-03-08 NOTE — Telephone Encounter (Signed)
Attempted to reach pt. With follow-up call following endoscopic procedure 03/05/2018.  LM on pt. Voice mail to call if he has any questions or concerns. 

## 2018-03-08 NOTE — Telephone Encounter (Signed)
First attempt, left VM.  

## 2018-03-11 ENCOUNTER — Encounter: Payer: Self-pay | Admitting: Internal Medicine

## 2018-03-26 ENCOUNTER — Encounter: Payer: Self-pay | Admitting: Nurse Practitioner

## 2018-03-26 ENCOUNTER — Ambulatory Visit (INDEPENDENT_AMBULATORY_CARE_PROVIDER_SITE_OTHER): Payer: BLUE CROSS/BLUE SHIELD | Admitting: Nurse Practitioner

## 2018-03-26 VITALS — BP 140/88 | HR 91 | Temp 98.5°F | Ht 70.5 in | Wt 219.0 lb

## 2018-03-26 DIAGNOSIS — J209 Acute bronchitis, unspecified: Secondary | ICD-10-CM | POA: Diagnosis not present

## 2018-03-26 MED ORDER — HYDROCODONE-HOMATROPINE 5-1.5 MG/5ML PO SYRP: 5.0000 mL | ORAL_SOLUTION | Freq: Three times a day (TID) | ORAL | 0 refills | Status: DC | PRN

## 2018-03-26 MED ORDER — AZITHROMYCIN 250 MG PO TABS: ORAL_TABLET | ORAL | 0 refills | Status: DC

## 2018-03-26 NOTE — Patient Instructions (Signed)
Take zithromax as presrcibed  You can use hycodan at night for cough, this medication can cause drowsiness. Please do not drink alcohol or operate machinery when you take this medication.  Please follow up for fevers over 101, if your symptoms get worse, or if your symptoms dont improve /with the antibiotic.   Acute Bronchitis, Adult Acute bronchitis is when air tubes (bronchi) in the lungs suddenly get swollen. The condition can make it hard to breathe. It can also cause these symptoms:  A cough.  Coughing up clear, yellow, or green mucus.  Wheezing.  Chest congestion.  Shortness of breath.  A fever.  Body aches.  Chills.  A sore throat. Follow these instructions at home:  Medicines  Take over-the-counter and prescription medicines only as told by your doctor.  If you were prescribed an antibiotic medicine, take it as told by your doctor. Do not stop taking the antibiotic even if you start to feel better. General instructions  Rest.  Drink enough fluids to keep your pee (urine) pale yellow.  Avoid smoking and secondhand smoke. If you smoke and you need help quitting, ask your doctor. Quitting will help your lungs heal faster.  Use an inhaler, cool mist vaporizer, or humidifier as told by your doctor.  Keep all follow-up visits as told by your doctor. This is important. How is this prevented? To lower your risk of getting this condition again:  Wash your hands often with soap and water. If you cannot use soap and water, use hand sanitizer.  Avoid contact with people who have cold symptoms.  Try not to touch your hands to your mouth, nose, or eyes.  Make sure to get the flu shot every year. Contact a doctor if:  Your symptoms do not get better in 2 weeks. Get help right away if:  You cough up blood.  You have chest pain.  You have very bad shortness of breath.  You become dehydrated.  You faint (pass out) or keep feeling like you are going to pass  out.  You keep throwing up (vomiting).  You have a very bad headache.  Your fever or chills gets worse. This information is not intended to replace advice given to you by your health care provider. Make sure you discuss any questions you have with your health care provider. Document Released: 08/27/2007 Document Revised: 10/22/2016 Document Reviewed: 08/29/2015 Elsevier Interactive Patient Education  2019 [employer].

## 2018-03-26 NOTE — Progress Notes (Signed)
Lance [name] is a 63 y.o. male with the following history as recorded in Epic[facility]:  Patient Active Problem List   Diagnosis Date Noted  . Right low back pain 01/09/2018  . OSA (obstructive sleep apnea) 01/02/2016  . Hypersomnolence 11/30/2015  . Insomnia 11/05/2013  . Abnormal liver function tests 11/04/2013  . Venereal disease, unspecified 11/04/2013  . Elevated blood pressure reading without diagnosis of hypertension 11/04/2013  . Impaired glucose tolerance 08/16/2011  . Preventative health care 08/16/2011  . OSTEOARTHRITIS, HANDS, BILATERAL 05/14/2010  . Hyperlipidemia 09/13/2007  . VENEREAL WART 10/21/2006  . Allergic rhinitis 10/21/2006    Current Outpatient Medications  Medication Sig Dispense Refill  . aspirin 81 MG tablet Take 81 mg by mouth daily.    . cetirizine (ZYRTEC) 10 MG tablet Take 1 tablet (10 mg total) by mouth daily. 30 tablet 5  . ibuprofen (ADVIL,MOTRIN) 800 MG tablet Take 1 tablet (800 mg total) by mouth every 8 (eight) hours as needed for pain. 21 tablet 0  . omeprazole (PRILOSEC) 40 MG capsule Take 1 capsule (40 mg total) by mouth daily. 90 capsule 1  . omeprazole (PRILOSEC) 40 MG capsule TAKE 1 CAPSULE BY MOUTH ONCE DAILY 90 capsule 3  . predniSONE (DELTASONE) 10 MG tablet 3 tabs by mouth per day for 3 days,2tabs per day for 3 days,1tab per day for 3 days 18 tablet 0  . sennosides-docusate sodium (SENOKOT-S) 8.6-50 MG tablet Take 1 tablet by mouth daily.    . simvastatin (ZOCOR) 40 MG tablet Take 1 tablet (40 mg total) by mouth daily. Keep Sept appt for future refills 90 tablet 1  . triamcinolone (NASACORT) 55 MCG/ACT AERO nasal inhaler Place 2 sprays into the nose daily. 1 Inhaler 12  . valACYclovir (VALTREX) 1000 MG tablet Take 1 tablet (1,000 mg total) by mouth daily. 90 tablet 5  . azithromycin (ZITHROMAX) 250 MG tablet Take 2 tablets today then 1 tablet daily until complete 6 tablet 0  . HYDROcodone-homatropine (HYCODAN) 5-1.5 MG/5ML syrup Take 5  mLs by mouth every 8 (eight) hours as needed for cough. 120 mL 0  . zolpidem (AMBIEN) 10 MG tablet Take 1 tablet (10 mg total) by mouth at bedtime as needed for sleep. 30 tablet 3   Current Facility-Administered Medications  Medication Dose Route Frequency Provider Last Rate Last Dose  . 0.9 %  sodium chloride infusion  500 mL Intravenous Once Pyrtle, Lajuan Lines, MD        Allergies: Patient has no known allergies.  Past Medical History:  Diagnosis Date  . ALLERGIC RHINITIS   . Allergy   . Constipation    uses Senna daily   . GERD (gastroesophageal reflux disease)   . HYPERLIPIDEMIA   . Impaired glucose tolerance   . OSTEOARTHRITIS, HANDS, BILATERAL     Past Surgical History:  Procedure Laterality Date  . COLONOSCOPY    . CYST EXCISION     neck and leg,drained and packed  . [place]   right  . POLYPECTOMY      Family History  Problem Relation Age of Onset  . Hypertension Other   . Diabetes Other   . Hypothyroidism Other   . Aneurysm Other   . Prostate cancer Father   . Cancer Sister        unsure of type   . Lung cancer Brother   . Lung cancer Sister   . Kidney disease Mother   . Colon cancer Neg Hx   .  Stomach cancer Neg Hx   . Colon polyps Neg Hx   . Esophageal cancer Neg Hx   . Rectal cancer Neg Hx     Social History   Tobacco Use  . Smoking status: Never Smoker  . Smokeless tobacco: Never Used  Substance Use Topics  . Alcohol use: No     Subjective:  [name] is here today requesting evaluation of acute complaint of cough/cold symptoms, which first began about  3 weeks ago, no better with OTC treatments, symptoms have not seemed to improve since onset. Reports headaches, body aches, chills, sweats, rhinorrhea, sneezing, dry cough. Denies: weakness, syncope, cp, sob, abd pain, n/v Smoker? No  Tried at home: mucinex, nyquil without relief  ROS- See HPI   Objective:  Vitals:   03/26/18 1402 03/26/18 1423  BP: (!) 142/98 140/88   Pulse: 91   Temp: 98.5 F (36.9 C)   TempSrc: Oral   SpO2: 97%   Weight: 219 lb (99.3 kg)   Height: 5' 10.5" (1.791 m)     General: Well developed, well nourished, in no acute distress  Skin : Warm and dry.  Head: Normocephalic and atraumatic  Eyes: Sclera and conjunctiva clear; pupils round and reactive to light; extraocular movements intact  Ears: External normal; canals clear; tympanic membranes normal  Oropharynx: Pink, supple. No suspicious lesions  Neck: Supple without thyromegaly, adenopathy  Lungs: Respirations unlabored; clear to auscultation bilaterally; coarse cough CVS exam: normal rate and regular rhythm Extremities: No edema, cyanosis  Vessels: Symmetric bilaterally  Neurologic: Alert and oriented; speech intact; face symmetrical; moves all extremities well; CNII-XII intact without focal deficit  Psychiatric: Normal mood and affect.  Assessment:  1. Acute bronchitis, unspecified organism     Plan:   Antibiotic course, PRN hycodan sent-medication dosing and side effects discussed Home management, red flags and return precautions including when to seek immediate care discussed and printed on AVS He will f/u for new, worsening symptoms or if symptoms are no better after antibiotic course  No follow-ups on file.  No orders of the defined types were placed in this encounter.   Requested Prescriptions   Signed Prescriptions Disp Refills  . azithromycin (ZITHROMAX) 250 MG tablet 6 tablet 0    Sig: Take 2 tablets today then 1 tablet daily until complete  . HYDROcodone-homatropine (HYCODAN) 5-1.5 MG/5ML syrup 120 mL 0    Sig: Take 5 mLs by mouth every 8 (eight) hours as needed for cough.

## 2018-03-30 ENCOUNTER — Other Ambulatory Visit: Payer: Self-pay | Admitting: Internal Medicine

## 2018-03-30 DIAGNOSIS — J209 Acute bronchitis, unspecified: Secondary | ICD-10-CM

## 2018-03-30 NOTE — Telephone Encounter (Signed)
Patient returning call and states that he needs a refill on azithromycin (ZITHROMAX) 250 MG tablet  and HYDROcodone-homatropine (HYCODAN) 5-1.5 MG/5ML syrup.  Please advise.

## 2018-03-30 NOTE — Telephone Encounter (Signed)
Called pt, LVM wanting clarification about which medication refills are needed.

## 2018-03-30 NOTE — Telephone Encounter (Signed)
Copied from [place] [id number]. Topic: Quick Communication - See Telephone Encounter >> Mar 30, 2018 12:03 PM Antonieta Iba C wrote: CRM for notification. See Telephone encounter for: 03/30/18.  Pt was seen by Ashleigh. Pt was told by provider that if he need a refill on medications prescribed she would send in to pharmacy for him   Pt would like to request a refill on prescribed medications.   Pharmacy: [facility] [address] ([address] [place] [phone] (Phone) [phone] (Fax)

## 2018-03-31 MED ORDER — HYDROCODONE-HOMATROPINE 5-1.5 MG/5ML PO SYRP: 5.0000 mL | ORAL_SOLUTION | Freq: Three times a day (TID) | ORAL | 0 refills | Status: DC | PRN

## 2018-03-31 MED ORDER — AZITHROMYCIN 250 MG PO TABS: ORAL_TABLET | ORAL | 0 refills | Status: DC

## 2018-03-31 NOTE — Addendum Note (Signed)
Addended by: Biagio Borg on: 03/31/2018 08:44 AM   Modules accepted: Orders

## 2018-03-31 NOTE — Telephone Encounter (Addendum)
Both Done erx 

## 2018-04-30 ENCOUNTER — Ambulatory Visit (INDEPENDENT_AMBULATORY_CARE_PROVIDER_SITE_OTHER): Payer: BLUE CROSS/BLUE SHIELD | Admitting: Internal Medicine

## 2018-04-30 ENCOUNTER — Encounter: Payer: Self-pay | Admitting: Internal Medicine

## 2018-04-30 VITALS — BP 140/90 | HR 87 | Temp 98.4°F | Ht 70.5 in | Wt 220.0 lb

## 2018-04-30 DIAGNOSIS — E785 Hyperlipidemia, unspecified: Secondary | ICD-10-CM | POA: Diagnosis not present

## 2018-04-30 DIAGNOSIS — G4710 Hypersomnia, unspecified: Secondary | ICD-10-CM

## 2018-04-30 DIAGNOSIS — R030 Elevated blood-pressure reading, without diagnosis of hypertension: Secondary | ICD-10-CM | POA: Diagnosis not present

## 2018-04-30 DIAGNOSIS — R7302 Impaired glucose tolerance (oral): Secondary | ICD-10-CM

## 2018-04-30 MED ORDER — SOLRIAMFETOL HCL 75 MG PO TABS: 1.0000 | ORAL_TABLET | Freq: Every day | ORAL | 5 refills | Status: DC

## 2018-04-30 NOTE — Assessment & Plan Note (Signed)
Mild to mod, for Sunosi 75 qd, to f/u any worsening symptoms or concerns

## 2018-04-30 NOTE — Patient Instructions (Signed)
Please take all new medication as prescribed - the Sunosi (we were able to send this with refills)  Please call if you feel you need the higher 150 mg dosing  Please continue all other medications as before, and refills have been done if requested.  Please have the pharmacy call with any other refills you may need.  Please continue your efforts at being more active, low cholesterol diet, and weight control.  Please keep your appointments with your specialists as you may have planned

## 2018-04-30 NOTE — Assessment & Plan Note (Signed)
stable overall by history and exam, recent data reviewed with pt, and pt to continue medical treatment as before,  to f/u any worsening symptoms or concerns  

## 2018-04-30 NOTE — Progress Notes (Signed)
Subjective:    Patient ID: Lance [name], male    DOB: [birth year], 63 y.o.   MRN: [id number]  HPI  Here to f/u; overall doing ok,  Pt denies chest pain, increasing sob or doe, wheezing, orthopnea, PND, increased LE swelling, palpitations, dizziness or syncope.  Pt denies new neurological symptoms such as new headache, or facial or extremity weakness or numbness.  Pt denies polydipsia, polyuria, or low sugar episode.  Pt states overall good compliance with meds, mostly trying to follow appropriate diet, with wt overall stable,  but little exercise however. Does have persistent daytime somnolence despite compliance with CPAP.  Has even difficulty and dangerous to drive.   Pt denies fever, wt loss, night sweats, loss of appetite, or other constitutional symptoms Past Medical History:  Diagnosis Date  . ALLERGIC RHINITIS   . Allergy   . Constipation    uses Senna daily   . GERD (gastroesophageal reflux disease)   . HYPERLIPIDEMIA   . Impaired glucose tolerance   . OSTEOARTHRITIS, HANDS, BILATERAL    Past Surgical History:  Procedure Laterality Date  . COLONOSCOPY    . CYST EXCISION     neck and leg,drained and packed  . [place]   right  . POLYPECTOMY      reports that he has never smoked. He has never used smokeless tobacco. He reports that he does not drink alcohol or use drugs. family history includes Aneurysm in an other family member; Cancer in his sister; Diabetes in an other family member; Hypertension in an other family member; Hypothyroidism in an other family member; Kidney disease in his mother; Lung cancer in his brother and sister; Prostate cancer in his father. No Known Allergies Current Outpatient Medications on File Prior to Visit  Medication Sig Dispense Refill  . aspirin 81 MG tablet Take 81 mg by mouth daily.    . cetirizine (ZYRTEC) 10 MG tablet Take 1 tablet (10 mg total) by mouth daily. 30 tablet 5  . ibuprofen (ADVIL,MOTRIN) 800 MG tablet  Take 1 tablet (800 mg total) by mouth every 8 (eight) hours as needed for pain. 21 tablet 0  . omeprazole (PRILOSEC) 40 MG capsule Take 1 capsule (40 mg total) by mouth daily. 90 capsule 1  . omeprazole (PRILOSEC) 40 MG capsule TAKE 1 CAPSULE BY MOUTH ONCE DAILY 90 capsule 3  . predniSONE (DELTASONE) 10 MG tablet 3 tabs by mouth per day for 3 days,2tabs per day for 3 days,1tab per day for 3 days 18 tablet 0  . sennosides-docusate sodium (SENOKOT-S) 8.6-50 MG tablet Take 1 tablet by mouth daily.    . simvastatin (ZOCOR) 40 MG tablet Take 1 tablet (40 mg total) by mouth daily. Keep Sept appt for future refills 90 tablet 1  . triamcinolone (NASACORT) 55 MCG/ACT AERO nasal inhaler Place 2 sprays into the nose daily. 1 Inhaler 12  . valACYclovir (VALTREX) 1000 MG tablet Take 1 tablet (1,000 mg total) by mouth daily. 90 tablet 5  . zolpidem (AMBIEN) 10 MG tablet Take 1 tablet (10 mg total) by mouth at bedtime as needed for sleep. 30 tablet 3   Current Facility-Administered Medications on File Prior to Visit  Medication Dose Route Frequency Provider Last Rate Last Dose  . 0.9 %  sodium chloride infusion  500 mL Intravenous Once Pyrtle, Lajuan Lines, MD       Review of Systems  Constitutional: Negative for other unusual diaphoresis or sweats HENT: Negative for ear discharge  or swelling Eyes: Negative for other worsening visual disturbances Respiratory: Negative for stridor or other swelling  Gastrointestinal: Negative for worsening distension or other blood Genitourinary: Negative for retention or other urinary change Musculoskeletal: Negative for other MSK pain or swelling Skin: Negative for color change or other new lesions Neurological: Negative for worsening tremors and other numbness  Psychiatric/Behavioral: Negative for worsening agitation or other fatigue All other system neg per pt    Objective:   Physical Exam BP 140/90   Pulse 87   Temp 98.4 F (36.9 C) (Oral)   Ht 5' 10.5" (1.791 m)    Wt 220 lb (99.8 kg)   SpO2 97%   BMI 31.12 kg/m  VS noted,  Constitutional: Pt appears in NAD HENT: Head: NCAT.  Right Ear: External ear normal.  Left Ear: External ear normal.  Eyes: . Pupils are equal, round, and reactive to light. Conjunctivae and EOM are normal Nose: without d/c or deformity Neck: Neck supple. Gross normal ROM Cardiovascular: Normal rate and regular rhythm.   Pulmonary/Chest: Effort normal and breath sounds without rales or wheezing.  Abd:  Soft, NT, ND, + BS, no organomegaly Neurological: Pt is alert. At baseline orientation, motor grossly intact Skin: Skin is warm. No rashes, other new lesions, no LE edema Psychiatric: Pt behavior is normal without agitation  No other exam findings Lab Results  Component Value Date   WBC 6.4 01/01/2018   HGB 14.2 01/01/2018   HCT 42.9 01/01/2018   PLT 176.0 01/01/2018   GLUCOSE 110 (H) 01/01/2018   CHOL 165 01/01/2018   TRIG 175.0 (H) 01/01/2018   HDL 43.90 01/01/2018   LDLDIRECT 160.3 05/11/2009   LDLCALC 87 01/01/2018   ALT 51 01/01/2018   AST 25 01/01/2018   NA 140 01/01/2018   K 4.0 01/01/2018   CL 103 01/01/2018   CREATININE 1.19 01/01/2018   BUN 15 01/01/2018   CO2 31 01/01/2018   TSH 2.14 01/01/2018   PSA 0.80 01/01/2018   HGBA1C 6.6 (H) 01/01/2018       Assessment & Plan:

## 2018-06-10 ENCOUNTER — Telehealth: Payer: Self-pay

## 2018-06-10 MED ORDER — SOLRIAMFETOL HCL 150 MG PO TABS: 150.0000 mg | ORAL_TABLET | Freq: Every day | ORAL | 1 refills | Status: DC

## 2018-06-10 NOTE — Telephone Encounter (Signed)
PT has been informed via VM

## 2018-06-10 NOTE — Telephone Encounter (Signed)
Copied from [place] [id number]. Topic: General - Other >> Jun 10, 2018  9:39 AM Leward Quan A wrote: Reason for CRM: Patient called to ask for an increase in the MG of  Solriamfetol HCl (SUNOSI) 75 MG TABS he states that he had this discussion with Dr Jenny Reichmann when he sent the Rx originally. Patient is asking for a new Rx to be sent to the pharmacy. Also asking for a call back when this is done ask to leave a message as he will be working and will not be able to answer the phone. Ph# [phone]

## 2018-06-10 NOTE — Addendum Note (Signed)
Addended by: Biagio Borg on: 06/10/2018 11:54 AM   Modules accepted: Orders

## 2018-06-10 NOTE — Telephone Encounter (Signed)
[place] for increase to 150 mg - done erx

## 2018-07-14 ENCOUNTER — Other Ambulatory Visit: Payer: Self-pay | Admitting: Internal Medicine

## 2018-08-18 ENCOUNTER — Other Ambulatory Visit: Payer: Self-pay | Admitting: Internal Medicine

## 2018-12-12 ENCOUNTER — Other Ambulatory Visit: Payer: Self-pay | Admitting: Internal Medicine

## 2018-12-17 ENCOUNTER — Other Ambulatory Visit: Payer: Self-pay | Admitting: Internal Medicine

## 2018-12-20 NOTE — Telephone Encounter (Signed)
I have not prescribed this medication for pt, perhaps he needs to ask the original prescriber for refill

## 2019-01-09 ENCOUNTER — Other Ambulatory Visit: Payer: Self-pay | Admitting: Internal Medicine

## 2019-01-10 ENCOUNTER — Other Ambulatory Visit (INDEPENDENT_AMBULATORY_CARE_PROVIDER_SITE_OTHER): Payer: Medicare Other

## 2019-01-10 DIAGNOSIS — Z0000 Encounter for general adult medical examination without abnormal findings: Secondary | ICD-10-CM

## 2019-01-10 DIAGNOSIS — R7302 Impaired glucose tolerance (oral): Secondary | ICD-10-CM | POA: Diagnosis not present

## 2019-01-10 LAB — HEPATIC FUNCTION PANEL
ALT: 43 U/L (ref 0–53)
AST: 27 U/L (ref 0–37)
Albumin: 4.7 g/dL (ref 3.5–5.2)
Alkaline Phosphatase: 41 U/L (ref 39–117)
Bilirubin, Direct: 0.2 mg/dL (ref 0.0–0.3)
Total Bilirubin: 1.3 mg/dL — ABNORMAL HIGH (ref 0.2–1.2)
Total Protein: 6.9 g/dL (ref 6.0–8.3)

## 2019-01-10 LAB — URINALYSIS, ROUTINE W REFLEX MICROSCOPIC
Bilirubin Urine: NEGATIVE
Ketones, ur: NEGATIVE
Leukocytes,Ua: NEGATIVE
Nitrite: NEGATIVE
Specific Gravity, Urine: 1.02 (ref 1.000–1.030)
Total Protein, Urine: NEGATIVE
Urine Glucose: NEGATIVE
Urobilinogen, UA: 0.2 (ref 0.0–1.0)
WBC, UA: NONE SEEN (ref 0–?)
pH: 6 (ref 5.0–8.0)

## 2019-01-10 LAB — CBC WITH DIFFERENTIAL/PLATELET
Basophils Absolute: 0 10*3/uL (ref 0.0–0.1)
Basophils Relative: 0.4 % (ref 0.0–3.0)
Eosinophils Absolute: 0.2 10*3/uL (ref 0.0–0.7)
Eosinophils Relative: 2.4 % (ref 0.0–5.0)
HCT: 44.4 % (ref 39.0–52.0)
Hemoglobin: 14.5 g/dL (ref 13.0–17.0)
Lymphocytes Relative: 40.9 % (ref 12.0–46.0)
Lymphs Abs: 3.3 10*3/uL (ref 0.7–4.0)
MCHC: 32.7 g/dL (ref 30.0–36.0)
MCV: 87.7 fl (ref 78.0–100.0)
Monocytes Absolute: 0.5 10*3/uL (ref 0.1–1.0)
Monocytes Relative: 6 % (ref 3.0–12.0)
Neutro Abs: 4 10*3/uL (ref 1.4–7.7)
Neutrophils Relative %: 50.3 % (ref 43.0–77.0)
Platelets: 221 10*3/uL (ref 150.0–400.0)
RBC: 5.06 Mil/uL (ref 4.22–5.81)
RDW: 12.7 % (ref 11.5–15.5)
WBC: 8 10*3/uL (ref 4.0–10.5)

## 2019-01-10 LAB — LIPID PANEL
Cholesterol: 171 mg/dL (ref 0–200)
HDL: 45 mg/dL (ref >39.00)
LDL Cholesterol: 99 mg/dL (ref 0–99)
NonHDL: 125.84
Total CHOL/HDL Ratio: 4
Triglycerides: 136 mg/dL (ref 0.0–149.0)
VLDL: 27.2 mg/dL (ref 0.0–40.0)

## 2019-01-10 LAB — BASIC METABOLIC PANEL
BUN: 15 mg/dL (ref 6–23)
CO2: 29 mEq/L (ref 19–32)
Calcium: 9.7 mg/dL (ref 8.4–10.5)
Chloride: 101 mEq/L (ref 96–112)
Creatinine, Ser: 1.24 mg/dL (ref 0.40–1.50)
GFR: 70.68 mL/min (ref >60.00)
Glucose, Bld: 111 mg/dL — ABNORMAL HIGH (ref 70–99)
Potassium: 4.5 mEq/L (ref 3.5–5.1)
Sodium: 140 mEq/L (ref 135–145)

## 2019-01-10 LAB — PSA: PSA: 0.91 ng/mL (ref 0.10–4.00)

## 2019-01-10 LAB — TSH: TSH: 1.75 u[IU]/mL (ref 0.35–4.50)

## 2019-01-10 LAB — HEMOGLOBIN A1C: Hgb A1c MFr Bld: 7.2 % — ABNORMAL HIGH (ref 4.6–6.5)

## 2019-01-14 ENCOUNTER — Ambulatory Visit (INDEPENDENT_AMBULATORY_CARE_PROVIDER_SITE_OTHER): Payer: Medicare Other | Admitting: Internal Medicine

## 2019-01-14 ENCOUNTER — Other Ambulatory Visit: Payer: Self-pay

## 2019-01-14 ENCOUNTER — Encounter: Payer: Self-pay | Admitting: Internal Medicine

## 2019-01-14 VITALS — BP 150/92 | HR 73 | Temp 98.2°F | Ht 70.0 in | Wt 221.0 lb

## 2019-01-14 DIAGNOSIS — E611 Iron deficiency: Secondary | ICD-10-CM | POA: Diagnosis not present

## 2019-01-14 DIAGNOSIS — Z23 Encounter for immunization: Secondary | ICD-10-CM | POA: Diagnosis not present

## 2019-01-14 DIAGNOSIS — J309 Allergic rhinitis, unspecified: Secondary | ICD-10-CM

## 2019-01-14 DIAGNOSIS — E559 Vitamin D deficiency, unspecified: Secondary | ICD-10-CM

## 2019-01-14 DIAGNOSIS — Z0000 Encounter for general adult medical examination without abnormal findings: Secondary | ICD-10-CM

## 2019-01-14 DIAGNOSIS — E538 Deficiency of other specified B group vitamins: Secondary | ICD-10-CM | POA: Diagnosis not present

## 2019-01-14 DIAGNOSIS — R7302 Impaired glucose tolerance (oral): Secondary | ICD-10-CM

## 2019-01-14 DIAGNOSIS — Z0001 Encounter for general adult medical examination with abnormal findings: Secondary | ICD-10-CM

## 2019-01-14 DIAGNOSIS — R030 Elevated blood-pressure reading, without diagnosis of hypertension: Secondary | ICD-10-CM

## 2019-01-14 MED ORDER — ATORVASTATIN CALCIUM 40 MG PO TABS: 40.0000 mg | ORAL_TABLET | Freq: Every day | ORAL | 3 refills | Status: DC

## 2019-01-14 MED ORDER — PREDNISONE 10 MG PO TABS: ORAL_TABLET | ORAL | 0 refills | Status: DC

## 2019-01-14 MED ORDER — ZOLPIDEM TARTRATE 10 MG PO TABS: 10.0000 mg | ORAL_TABLET | Freq: Every evening | ORAL | 1 refills | Status: DC | PRN

## 2019-01-14 NOTE — Assessment & Plan Note (Signed)
stable overall by history and exam, recent data reviewed with pt, and pt to continue medical treatment as before,  to f/u any worsening symptoms or concerns  

## 2019-01-14 NOTE — Assessment & Plan Note (Signed)

## 2019-01-14 NOTE — Assessment & Plan Note (Signed)
I suspect he may have HTN, but declines further med for now, wants to work on diet and wt control

## 2019-01-14 NOTE — Assessment & Plan Note (Addendum)
Mild to mod seasonal flare - for predpac asd,  to f/u any worsening symptoms or concerns  In addition to the time spent performing CPE, I spent an additional 15 minutes face to face,in which greater than 50% of this time was spent in counseling and coordination of care for patient's illness as documented, including the differential dx, treatment, further evaluation and other management of allergic rhinitis, elev BP without HTN, and hyperglycemia

## 2019-01-14 NOTE — Patient Instructions (Addendum)
You had the Prevnar 13 pneumonia shot today  Please take all new medication as prescribed - the prednisone  OK to change the simvastatin to generic lipitor 40 mg per day as this is more effective  Please continue all other medications as before, and refills have been done if requested.  Please have the pharmacy call with any other refills you may need.  Please continue your efforts at being more active, low cholesterol diet, and weight control.  You are otherwise up to date with prevention measures today.  Please keep your appointments with your specialists as you may have planned  Please return in 6 months, or sooner if needed, with Lab testing done 3-5 days before

## 2019-01-14 NOTE — Progress Notes (Signed)
Subjective:    Patient ID: Lance [name], male    DOB: [birth year], 63 y.o.   MRN: [id number]  HPI  Here for wellness and f/u;  Overall doing ok;  Pt denies Chest pain, worsening SOB, DOE, wheezing, orthopnea, PND, worsening LE edema, palpitations, dizziness or syncope.  Pt denies neurological change such as new headache, facial or extremity weakness.  Pt denies polydipsia, polyuria, or low sugar symptoms. Pt states overall good compliance with treatment and medications, good tolerability, and has been trying to follow appropriate diet.  Pt denies worsening depressive symptoms, suicidal ideation or panic. No fever, night sweats, wt loss, loss of appetite, or other constitutional symptoms.  Pt states good ability with ADL's, has low fall risk, home safety reviewed and adequate, no other significant changes in hearing or vision, and only occasionally active with exercise.  Starting semi-retirement so plans to be more active.  Also, Does have several wks ongoing nasal allergy symptoms with clearish congestion, itch and sneezing, without fever, pain, ST, cough, swelling or wheezing. Past Medical History:  Diagnosis Date  . ALLERGIC RHINITIS   . Allergy   . Constipation    uses Senna daily   . GERD (gastroesophageal reflux disease)   . HYPERLIPIDEMIA   . Impaired glucose tolerance   . OSTEOARTHRITIS, HANDS, BILATERAL    Past Surgical History:  Procedure Laterality Date  . COLONOSCOPY    . CYST EXCISION     neck and leg,drained and packed  . [place]   right  . POLYPECTOMY      reports that he has never smoked. He has never used smokeless tobacco. He reports that he does not drink alcohol or use drugs. family history includes Aneurysm in an other family member; Cancer in his sister; Diabetes in an other family member; Hypertension in an other family member; Hypothyroidism in an other family member; Kidney disease in his mother; Lung cancer in his brother and sister;  Prostate cancer in his father. No Known Allergies Current Outpatient Medications on File Prior to Visit  Medication Sig Dispense Refill  . aspirin 81 MG tablet Take 81 mg by mouth daily.    . cetirizine (ZYRTEC) 10 MG tablet Take 1 tablet by mouth once daily 30 tablet 5  . ibuprofen (ADVIL,MOTRIN) 800 MG tablet Take 1 tablet (800 mg total) by mouth every 8 (eight) hours as needed for pain. 21 tablet 0  . omeprazole (PRILOSEC) 40 MG capsule Take 1 capsule (40 mg total) by mouth daily. 90 capsule 1  . omeprazole (PRILOSEC) 40 MG capsule Take 1 capsule by mouth once daily 90 capsule 0  . sennosides-docusate sodium (SENOKOT-S) 8.6-50 MG tablet Take 1 tablet by mouth daily.    . Solriamfetol HCl (SUNOSI) 150 MG TABS Take 150 mg by mouth daily. 90 tablet 1  . triamcinolone (NASACORT) 55 MCG/ACT AERO nasal inhaler Place 2 sprays into the nose daily. 1 Inhaler 12  . valACYclovir (VALTREX) 1000 MG tablet Take 1 tablet by mouth once daily 90 tablet 0   No current facility-administered medications on file prior to visit.    Review of Systems  Constitutional: Negative for other unusual diaphoresis or sweats HENT: Negative for ear discharge or swelling Eyes: Negative for other worsening visual disturbances Respiratory: Negative for stridor or other swelling  Gastrointestinal: Negative for worsening distension or other blood Genitourinary: Negative for retention or other urinary change Musculoskeletal: Negative for other MSK pain or swelling Skin: Negative for color change or  other new lesions Neurological: Negative for worsening tremors and other numbness  Psychiatric/Behavioral: Negative for worsening agitation or other fatigue All otherwise neg per pt     Objective:   Physical Exam BP (!) 150/92 (BP Location: Left Arm, Patient Position: Sitting, Cuff Size: Large)   Pulse 73   Temp 98.2 F (36.8 C) (Oral)   Ht 5\' 10"  (1.778 m)   Wt 221 lb (100.2 kg)   SpO2 97%   BMI 31.71 kg/m  VS noted,   Constitutional: Pt appears in NAD HENT: Head: NCAT.  Right Ear: External ear normal.  Left Ear: External ear normal.  Eyes: . Pupils are equal, round, and reactive to light. Conjunctivae and EOM are normal Nose: without d/c or deformity Neck: Neck supple. Gross normal ROM Cardiovascular: Normal rate and regular rhythm.   Pulmonary/Chest: Effort normal and breath sounds without rales or wheezing.  Abd:  Soft, NT, ND, + BS, no organomegaly Neurological: Pt is alert. At baseline orientation, motor grossly intact Skin: Skin is warm. No rashes, other new lesions, no LE edema Psychiatric: Pt behavior is normal without agitation  All otherwise neg per pt  Lab Results  Component Value Date   WBC 8.0 01/10/2019   HGB 14.5 01/10/2019   HCT 44.4 01/10/2019   PLT 221.0 01/10/2019   GLUCOSE 111 (H) 01/10/2019   CHOL 171 01/10/2019   TRIG 136.0 01/10/2019   HDL 45.00 01/10/2019   LDLDIRECT 160.3 05/11/2009   LDLCALC 99 01/10/2019   ALT 43 01/10/2019   AST 27 01/10/2019   NA 140 01/10/2019   K 4.5 01/10/2019   CL 101 01/10/2019   CREATININE 1.24 01/10/2019   BUN 15 01/10/2019   CO2 29 01/10/2019   TSH 1.75 01/10/2019   PSA 0.91 01/10/2019   HGBA1C 7.2 (H) 01/10/2019        Assessment & Plan:

## 2019-02-19 ENCOUNTER — Other Ambulatory Visit: Payer: Self-pay | Admitting: Internal Medicine

## 2019-03-24 ENCOUNTER — Other Ambulatory Visit: Payer: Self-pay | Admitting: Internal Medicine

## 2019-03-29 ENCOUNTER — Other Ambulatory Visit: Payer: Self-pay | Admitting: Internal Medicine

## 2019-04-16 ENCOUNTER — Other Ambulatory Visit: Payer: Self-pay | Admitting: Internal Medicine

## 2019-04-16 NOTE — Telephone Encounter (Signed)
Please refill as per office routine med refill policy (all routine meds refilled for 3 mo or monthly per pt preference up to one year from last visit, then month to month grace period for 3 mo, then further med refills will have to be denied)  

## 2019-04-25 ENCOUNTER — Other Ambulatory Visit: Payer: Self-pay | Admitting: Internal Medicine

## 2019-05-16 ENCOUNTER — Other Ambulatory Visit: Payer: Self-pay | Admitting: Internal Medicine

## 2019-05-16 NOTE — Telephone Encounter (Signed)
Please refill as per office routine med refill policy (all routine meds refilled for 3 mo or monthly per pt preference up to one year from last visit, then month to month grace period for 3 mo, then further med refills will have to be denied)  

## 2019-06-24 ENCOUNTER — Other Ambulatory Visit: Payer: Self-pay | Admitting: Internal Medicine

## 2019-07-11 ENCOUNTER — Other Ambulatory Visit (INDEPENDENT_AMBULATORY_CARE_PROVIDER_SITE_OTHER): Payer: Medicare (Managed Care)

## 2019-07-11 DIAGNOSIS — E611 Iron deficiency: Secondary | ICD-10-CM

## 2019-07-11 DIAGNOSIS — R7302 Impaired glucose tolerance (oral): Secondary | ICD-10-CM

## 2019-07-11 DIAGNOSIS — E538 Deficiency of other specified B group vitamins: Secondary | ICD-10-CM

## 2019-07-11 DIAGNOSIS — E559 Vitamin D deficiency, unspecified: Secondary | ICD-10-CM

## 2019-07-11 LAB — LIPID PANEL
Cholesterol: 145 mg/dL (ref 0–200)
HDL: 47.7 mg/dL (ref >39.00)
LDL Cholesterol: 67 mg/dL (ref 0–99)
NonHDL: 97.5
Total CHOL/HDL Ratio: 3
Triglycerides: 152 mg/dL — ABNORMAL HIGH (ref 0.0–149.0)
VLDL: 30.4 mg/dL (ref 0.0–40.0)

## 2019-07-11 LAB — IBC PANEL
Iron: 89 ug/dL (ref 42–165)
Saturation Ratios: 27.6 % (ref 20.0–50.0)
Transferrin: 230 mg/dL (ref 212.0–360.0)

## 2019-07-11 LAB — HEMOGLOBIN A1C: Hgb A1c MFr Bld: 7.1 % — ABNORMAL HIGH (ref 4.6–6.5)

## 2019-07-11 LAB — BASIC METABOLIC PANEL
BUN: 10 mg/dL (ref 6–23)
CO2: 33 mEq/L — ABNORMAL HIGH (ref 19–32)
Calcium: 9.5 mg/dL (ref 8.4–10.5)
Chloride: 100 mEq/L (ref 96–112)
Creatinine, Ser: 1.13 mg/dL (ref 0.40–1.50)
GFR: 78.56 mL/min (ref >60.00)
Glucose, Bld: 123 mg/dL — ABNORMAL HIGH (ref 70–99)
Potassium: 4.1 mEq/L (ref 3.5–5.1)
Sodium: 139 mEq/L (ref 135–145)

## 2019-07-11 LAB — HEPATIC FUNCTION PANEL
ALT: 53 U/L (ref 0–53)
AST: 33 U/L (ref 0–37)
Albumin: 4.6 g/dL (ref 3.5–5.2)
Alkaline Phosphatase: 48 U/L (ref 39–117)
Bilirubin, Direct: 0.2 mg/dL (ref 0.0–0.3)
Total Bilirubin: 1.5 mg/dL — ABNORMAL HIGH (ref 0.2–1.2)
Total Protein: 7.1 g/dL (ref 6.0–8.3)

## 2019-07-11 LAB — VITAMIN D 25 HYDROXY (VIT D DEFICIENCY, FRACTURES): VITD: 35.86 ng/mL (ref 30.00–100.00)

## 2019-07-11 LAB — VITAMIN B12: Vitamin B-12: 250 pg/mL (ref 211–911)

## 2019-07-15 ENCOUNTER — Encounter: Payer: Self-pay | Admitting: Internal Medicine

## 2019-07-15 ENCOUNTER — Ambulatory Visit (INDEPENDENT_AMBULATORY_CARE_PROVIDER_SITE_OTHER): Payer: Medicare (Managed Care) | Admitting: Internal Medicine

## 2019-07-15 ENCOUNTER — Other Ambulatory Visit: Payer: Self-pay

## 2019-07-15 VITALS — BP 160/80 | HR 84 | Temp 98.3°F | Ht 70.0 in | Wt 231.0 lb

## 2019-07-15 DIAGNOSIS — J4521 Mild intermittent asthma with (acute) exacerbation: Secondary | ICD-10-CM

## 2019-07-15 DIAGNOSIS — R030 Elevated blood-pressure reading, without diagnosis of hypertension: Secondary | ICD-10-CM | POA: Diagnosis not present

## 2019-07-15 DIAGNOSIS — E119 Type 2 diabetes mellitus without complications: Secondary | ICD-10-CM | POA: Diagnosis not present

## 2019-07-15 DIAGNOSIS — J309 Allergic rhinitis, unspecified: Secondary | ICD-10-CM

## 2019-07-15 DIAGNOSIS — Z23 Encounter for immunization: Secondary | ICD-10-CM

## 2019-07-15 DIAGNOSIS — E785 Hyperlipidemia, unspecified: Secondary | ICD-10-CM

## 2019-07-15 DIAGNOSIS — Z0000 Encounter for general adult medical examination without abnormal findings: Secondary | ICD-10-CM

## 2019-07-15 MED ORDER — PREDNISONE 10 MG PO TABS: ORAL_TABLET | ORAL | 0 refills | Status: DC

## 2019-07-15 MED ORDER — ALBUTEROL SULFATE HFA 108 (90 BASE) MCG/ACT IN AERS: 2.0000 | INHALATION_SPRAY | Freq: Four times a day (QID) | RESPIRATORY_TRACT | 5 refills | Status: DC | PRN

## 2019-07-15 MED ORDER — FLUTICASONE-SALMETEROL 250-50 MCG/DOSE IN AEPB: 1.0000 | INHALATION_SPRAY | Freq: Two times a day (BID) | RESPIRATORY_TRACT | 11 refills | Status: DC

## 2019-07-15 NOTE — Patient Instructions (Addendum)
You had the Tdap tetanus shot today  Ok to watch the tick bite wound for now, as it should eventually heal  Please take all new medication as prescribed - the albuterol inhaler as needed for wheezing and shortness of breath, and the advair at least in the springtime for asthma control  Please take all new medication as prescribed - the prednisone just for a few days  Please continue all other medications as before, and refills have been done if requested.  Please have the pharmacy call with any other refills you may need.  Please continue your efforts at being more active, low cholesterol diet, and weight control, and remember to follow your Blood Pressure on a regular basis, with the goal being less than 140/90 (or less than 130/80 even better  Please keep your appointments with your specialists as you may have planned  Please make an Appointment to return in 6 months, or sooner if needed, also with Lab Appointment for testing done 3-5 days before at the [facility] (so this is for TWO appointments - please see the scheduling desk as you leave)

## 2019-07-15 NOTE — Progress Notes (Signed)
Subjective:    Patient ID: Lance [name], male    DOB: 1955-05-12, 64 y.o.   MRN: [id number]  HPI  Here to f/u; overall doing ok,  Pt denies chest pain, increasing sob or doe, wheezing, orthopnea, PND, increased LE swelling, palpitations, dizziness or syncope.  Pt denies new neurological symptoms such as new headache, or facial or extremity weakness or numbness.  Pt denies polydipsia, polyuria, or low sugar episode.  Pt states overall good compliance with meds, mostly trying to follow appropriate diet, with wt overall stable,  but little exercise however. BP seems to be increased with the pandemic wt gain.  Trying to plan on being more active and getting some wt loss.  BP Readings from Last 3 Encounters:  07/15/19 (!) 160/80  01/14/19 (!) 150/92  04/30/18 140/90   Wt Readings from Last 3 Encounters:  07/15/19 231 lb (104.8 kg)  01/14/19 221 lb (100.2 kg)  04/30/18 220 lb (99.8 kg)  Does have several wks ongoing nasal allergy symptoms with clearish congestion, itch and sneezing, without fever, pain, ST, cough, swelling, but also has had mild intermittent wheezing and sob last few months. Past Medical History:  Diagnosis Date  . ALLERGIC RHINITIS   . Allergy   . Constipation    uses Senna daily   . GERD (gastroesophageal reflux disease)   . HYPERLIPIDEMIA   . Impaired glucose tolerance   . OSTEOARTHRITIS, HANDS, BILATERAL    Past Surgical History:  Procedure Laterality Date  . COLONOSCOPY    . CYST EXCISION     neck and leg,drained and packed  . [place]   right  . POLYPECTOMY      reports that he has never smoked. He has never used smokeless tobacco. He reports that he does not drink alcohol or use drugs. family history includes Aneurysm in an other family member; Cancer in his sister; Diabetes in an other family member; Hypertension in an other family member; Hypothyroidism in an other family member; Kidney disease in his mother; Lung cancer in his  brother and sister; Prostate cancer in his father. No Known Allergies Current Outpatient Medications on File Prior to Visit  Medication Sig Dispense Refill  . aspirin 81 MG tablet Take 81 mg by mouth daily.    Marland Kitchen atorvastatin (LIPITOR) 40 MG tablet Take 1 tablet (40 mg total) by mouth daily. 90 tablet 3  . cetirizine (ZYRTEC) 10 MG tablet Take 1 tablet by mouth once daily 30 tablet 5  . ibuprofen (ADVIL,MOTRIN) 800 MG tablet Take 1 tablet (800 mg total) by mouth every 8 (eight) hours as needed for pain. 21 tablet 0  . omeprazole (PRILOSEC) 40 MG capsule Take 1 capsule (40 mg total) by mouth daily. 90 capsule 1  . omeprazole (PRILOSEC) 40 MG capsule Take 1 capsule by mouth once daily 90 capsule 0  . sennosides-docusate sodium (SENOKOT-S) 8.6-50 MG tablet Take 1 tablet by mouth daily.    . Solriamfetol HCl (SUNOSI) 150 MG TABS Take 150 mg by mouth daily. 90 tablet 1  . triamcinolone (NASACORT) 55 MCG/ACT AERO nasal inhaler Place 2 sprays into the nose daily. 1 Inhaler 12  . valACYclovir (VALTREX) 1000 MG tablet Take 1 tablet by mouth once daily 90 tablet 02  . zolpidem (AMBIEN) 10 MG tablet Take 1 tablet (10 mg total) by mouth at bedtime as needed for sleep. 90 tablet 1   No current facility-administered medications on file prior to visit.   Review of  Systems All otherwise neg per pt     Objective:   Physical Exam BP (!) 160/80 (BP Location: Left Arm, Patient Position: Sitting, Cuff Size: Large)   Pulse 84   Temp 98.3 F (36.8 C) (Oral)   Ht 5\' 10"  (1.778 m)   Wt 231 lb (104.8 kg)   SpO2 95%   BMI 33.15 kg/m  VS noted,  Constitutional: Pt appears in NAD HENT: Head: NCAT.  Right Ear: External ear normal.  Left Ear: External ear normal.  Bilat tm's with mild erythema.  Max sinus areas non tender.  Pharynx with mild erythema, no exudate Eyes: . Pupils are equal, round, and reactive to light. Conjunctivae and EOM are normal Nose: without d/c or deformity Neck: Neck supple. Gross  normal ROM Cardiovascular: Normal rate and regular rhythm.   Pulmonary/Chest: Effort normal and breath sounds without rales or wheezing.  Abd:  Soft, NT, ND, + BS, no organomegaly Neurological: Pt is alert. At baseline orientation, motor grossly intact Skin: Skin is warm. No rashes, other new lesions, no LE edema Psychiatric: Pt behavior is normal without agitation  All otherwise neg per pt ' Lab Results  Component Value Date   WBC 8.0 01/10/2019   HGB 14.5 01/10/2019   HCT 44.4 01/10/2019   PLT 221.0 01/10/2019   GLUCOSE 123 (H) 07/11/2019   CHOL 145 07/11/2019   TRIG 152.0 (H) 07/11/2019   HDL 47.70 07/11/2019   LDLDIRECT 160.3 05/11/2009   LDLCALC 67 07/11/2019   ALT 53 07/11/2019   AST 33 07/11/2019   NA 139 07/11/2019   K 4.1 07/11/2019   CL 100 07/11/2019   CREATININE 1.13 07/11/2019   BUN 10 07/11/2019   CO2 33 (H) 07/11/2019   TSH 1.75 01/10/2019   PSA 0.91 01/10/2019   HGBA1C 7.1 (H) 07/11/2019      Assessment & Plan:

## 2019-07-16 ENCOUNTER — Encounter: Payer: Self-pay | Admitting: Internal Medicine

## 2019-07-16 DIAGNOSIS — J45909 Unspecified asthma, uncomplicated: Secondary | ICD-10-CM | POA: Insufficient documentation

## 2019-07-16 NOTE — Assessment & Plan Note (Signed)
stable overall by history and exam, recent data reviewed with pt, and pt to continue medical treatment as before,  to f/u any worsening symptoms or concerns  

## 2019-07-16 NOTE — Assessment & Plan Note (Signed)
Mild to mod flare,, for predpac asd,  to f/u any worsening symptoms or concerns

## 2019-07-16 NOTE — Assessment & Plan Note (Signed)
New onset, for precpac as above, albuterol hfa prn, advair asd,  to f/u any worsening symptoms or concerns

## 2019-07-16 NOTE — Assessment & Plan Note (Addendum)
Exam cw likely htn, declines med tx to work on lifestyle changes and wt control; for bp at home and next visit  I spent 35 minutes in preparing to see the patient by review of recent labs, imaging and procedures, obtaining and reviewing separately obtained history, communicating with the patient and family or caregiver, ordering medications, tests or procedures, and documenting clinical information in the EHR including the differential Dx, treatment, and any further evaluation and other management of elevated bp, allergies, asthma, DM, HLD

## 2019-08-17 ENCOUNTER — Other Ambulatory Visit: Payer: Self-pay | Admitting: Internal Medicine

## 2019-10-21 ENCOUNTER — Other Ambulatory Visit: Payer: Self-pay | Admitting: Internal Medicine

## 2020-01-09 ENCOUNTER — Other Ambulatory Visit: Payer: Self-pay | Admitting: Internal Medicine

## 2020-01-09 NOTE — Telephone Encounter (Signed)
Please refill as per office routine med refill policy (all routine meds refilled for 3 mo or monthly per pt preference up to one year from last visit, then month to month grace period for 3 mo, then further med refills will have to be denied)  

## 2020-01-16 ENCOUNTER — Other Ambulatory Visit: Payer: Self-pay

## 2020-01-16 ENCOUNTER — Other Ambulatory Visit (INDEPENDENT_AMBULATORY_CARE_PROVIDER_SITE_OTHER): Payer: Medicare (Managed Care)

## 2020-01-16 ENCOUNTER — Other Ambulatory Visit: Payer: Self-pay | Admitting: Internal Medicine

## 2020-01-16 DIAGNOSIS — E119 Type 2 diabetes mellitus without complications: Secondary | ICD-10-CM

## 2020-01-16 DIAGNOSIS — Z0000 Encounter for general adult medical examination without abnormal findings: Secondary | ICD-10-CM

## 2020-01-16 LAB — HEPATIC FUNCTION PANEL
ALT: 37 U/L (ref 0–53)
AST: 24 U/L (ref 0–37)
Albumin: 4.6 g/dL (ref 3.5–5.2)
Alkaline Phosphatase: 46 U/L (ref 39–117)
Bilirubin, Direct: 0.2 mg/dL (ref 0.0–0.3)
Total Bilirubin: 1.6 mg/dL — ABNORMAL HIGH (ref 0.2–1.2)
Total Protein: 6.5 g/dL (ref 6.0–8.3)

## 2020-01-16 LAB — BASIC METABOLIC PANEL
BUN: 10 mg/dL (ref 6–23)
CO2: 28 mEq/L (ref 19–32)
Calcium: 9.4 mg/dL (ref 8.4–10.5)
Chloride: 103 mEq/L (ref 96–112)
Creatinine, Ser: 1.23 mg/dL (ref 0.40–1.50)
GFR: 61.17 mL/min (ref >60.00)
Glucose, Bld: 118 mg/dL — ABNORMAL HIGH (ref 70–99)
Potassium: 4 mEq/L (ref 3.5–5.1)
Sodium: 140 mEq/L (ref 135–145)

## 2020-01-16 LAB — URINALYSIS, ROUTINE W REFLEX MICROSCOPIC
Bilirubin Urine: NEGATIVE
Ketones, ur: NEGATIVE
Leukocytes,Ua: NEGATIVE
Nitrite: NEGATIVE
Specific Gravity, Urine: 1.02 (ref 1.000–1.030)
Total Protein, Urine: NEGATIVE
Urine Glucose: NEGATIVE
Urobilinogen, UA: 0.2 (ref 0.0–1.0)
pH: 6.5 (ref 5.0–8.0)

## 2020-01-16 LAB — LIPID PANEL
Cholesterol: 140 mg/dL (ref 0–200)
HDL: 46.4 mg/dL (ref >39.00)
LDL Cholesterol: 70 mg/dL (ref 0–99)
NonHDL: 93.29
Total CHOL/HDL Ratio: 3
Triglycerides: 117 mg/dL (ref 0.0–149.0)
VLDL: 23.4 mg/dL (ref 0.0–40.0)

## 2020-01-16 LAB — MICROALBUMIN / CREATININE URINE RATIO
Creatinine,U: 285.8 mg/dL
Microalb Creat Ratio: 0.9 mg/g (ref 0.0–30.0)
Microalb, Ur: 2.4 mg/dL — ABNORMAL HIGH (ref 0.0–1.9)

## 2020-01-16 LAB — CBC WITH DIFFERENTIAL/PLATELET
Basophils Absolute: 0 10*3/uL (ref 0.0–0.1)
Basophils Relative: 0.4 % (ref 0.0–3.0)
Eosinophils Absolute: 0.4 10*3/uL (ref 0.0–0.7)
Eosinophils Relative: 5.8 % — ABNORMAL HIGH (ref 0.0–5.0)
HCT: 41.7 % (ref 39.0–52.0)
Hemoglobin: 14.1 g/dL (ref 13.0–17.0)
Lymphocytes Relative: 37.7 % (ref 12.0–46.0)
Lymphs Abs: 2.5 10*3/uL (ref 0.7–4.0)
MCHC: 33.8 g/dL (ref 30.0–36.0)
MCV: 87.5 fl (ref 78.0–100.0)
Monocytes Absolute: 0.4 10*3/uL (ref 0.1–1.0)
Monocytes Relative: 6.4 % (ref 3.0–12.0)
Neutro Abs: 3.3 10*3/uL (ref 1.4–7.7)
Neutrophils Relative %: 49.7 % (ref 43.0–77.0)
Platelets: 182 10*3/uL (ref 150.0–400.0)
RBC: 4.77 Mil/uL (ref 4.22–5.81)
RDW: 13.4 % (ref 11.5–15.5)
WBC: 6.6 10*3/uL (ref 4.0–10.5)

## 2020-01-16 LAB — HEMOGLOBIN A1C: Hgb A1c MFr Bld: 7.5 % — ABNORMAL HIGH (ref 4.6–6.5)

## 2020-01-16 LAB — PSA: PSA: 0.7 ng/mL (ref 0.10–4.00)

## 2020-01-16 LAB — TSH: TSH: 2.12 u[IU]/mL (ref 0.35–4.50)

## 2020-01-16 NOTE — Telephone Encounter (Signed)
Please refill as per office routine med refill policy (all routine meds refilled for 3 mo or monthly per pt preference up to one year from last visit, then month to month grace period for 3 mo, then further med refills will have to be denied)  

## 2020-01-19 ENCOUNTER — Telehealth: Payer: Self-pay | Admitting: Internal Medicine

## 2020-01-19 NOTE — Telephone Encounter (Signed)
LVM for pt to rtn my call to schedule AWV-I with [facility]. If patietn calls the office please schedule AWV-I with [facility].

## 2020-01-20 ENCOUNTER — Ambulatory Visit (INDEPENDENT_AMBULATORY_CARE_PROVIDER_SITE_OTHER): Payer: Medicare (Managed Care) | Admitting: Internal Medicine

## 2020-01-20 ENCOUNTER — Other Ambulatory Visit: Payer: Self-pay

## 2020-01-20 VITALS — BP 158/88 | HR 80 | Temp 98.3°F | Ht 70.0 in | Wt 225.0 lb

## 2020-01-20 DIAGNOSIS — E119 Type 2 diabetes mellitus without complications: Secondary | ICD-10-CM | POA: Diagnosis not present

## 2020-01-20 DIAGNOSIS — R21 Rash and other nonspecific skin eruption: Secondary | ICD-10-CM

## 2020-01-20 DIAGNOSIS — I10 Essential (primary) hypertension: Secondary | ICD-10-CM

## 2020-01-20 DIAGNOSIS — Z23 Encounter for immunization: Secondary | ICD-10-CM | POA: Diagnosis not present

## 2020-01-20 DIAGNOSIS — Z0000 Encounter for general adult medical examination without abnormal findings: Secondary | ICD-10-CM | POA: Diagnosis not present

## 2020-01-20 MED ORDER — LOSARTAN POTASSIUM 50 MG PO TABS: 50.0000 mg | ORAL_TABLET | Freq: Every day | ORAL | 3 refills | Status: DC

## 2020-01-20 MED ORDER — KETOCONAZOLE 2 % EX CREA: 1.0000 "application " | TOPICAL_CREAM | Freq: Every day | CUTANEOUS | 1 refills | Status: DC

## 2020-01-20 MED ORDER — METFORMIN HCL ER 500 MG PO TB24: 500.0000 mg | ORAL_TABLET | Freq: Every day | ORAL | 3 refills | Status: DC

## 2020-01-20 MED ORDER — ZOLPIDEM TARTRATE 10 MG PO TABS: 10.0000 mg | ORAL_TABLET | Freq: Every evening | ORAL | 1 refills | Status: DC | PRN

## 2020-01-20 NOTE — Patient Instructions (Signed)
You had the flu and pneumovax pneumonia shots today  Please take all new medication as prescribed - the losartan 50 mg per day for blood pressure, and the metformin ER 500 mg per day for sugar  Please take all new medication as prescribed - also the cream for the foot fungus  Please continue all other medications as before, and refills have been done if requested - the ambien  Please have the pharmacy call with any other refills you may need.  Please continue your efforts at being more active, low cholesterol diet, and weight control.  You are otherwise up to date with prevention measures today.  Please keep your appointments with your specialists as you may have planned  Please make an Appointment to return in 6 months, or sooner if needed, also with Lab Appointment for testing done 3-5 days before at the [facility] (so this is for TWO appointments - please see the scheduling desk as you leave)

## 2020-01-20 NOTE — Progress Notes (Signed)
Subjective:    Patient ID: Lance [name], male    DOB: [birth year], 64 y.o.   MRN: [id number]  HPI  Here for wellness and f/u;  Overall doing ok;  Pt denies Chest pain, worsening SOB, DOE, wheezing, orthopnea, PND, worsening LE edema, palpitations, dizziness or syncope.  Pt denies neurological change such as new headache, facial or extremity weakness.  Pt denies polydipsia, polyuria, or low sugar symptoms. Pt states overall good compliance with treatment and medications, good tolerability, and has been trying to follow appropriate diet.  Pt denies worsening depressive symptoms, suicidal ideation or panic. No fever, night sweats, wt loss, loss of appetite, or other constitutional symptoms.  Pt states good ability with ADL's, has low fall risk, home safety reviewed and adequate, no other significant changes in hearing or vision, and only occasionally active with exercise.   Plans to call for eye exam soon. Also has foot rash whitish with itching to both feet for several weeks Past Medical History:  Diagnosis Date  . ALLERGIC RHINITIS   . Allergy   . Constipation    uses Senna daily   . GERD (gastroesophageal reflux disease)   . HYPERLIPIDEMIA   . Impaired glucose tolerance   . OSTEOARTHRITIS, HANDS, BILATERAL    Past Surgical History:  Procedure Laterality Date  . COLONOSCOPY    . CYST EXCISION     neck and leg,drained and packed  . [place]   right  . POLYPECTOMY      reports that he has never smoked. He has never used smokeless tobacco. He reports that he does not drink alcohol and does not use drugs. family history includes Aneurysm in an other family member; Cancer in his sister; Diabetes in an other family member; Hypertension in an other family member; Hypothyroidism in an other family member; Kidney disease in his mother; Lung cancer in his brother and sister; Prostate cancer in his father. No Known Allergies Current Outpatient Medications on File Prior to  Visit  Medication Sig Dispense Refill  . albuterol (VENTOLIN HFA) 108 (90 Base) MCG/ACT inhaler Inhale 2 puffs into the lungs every 6 (six) hours as needed for wheezing or shortness of breath. 18 g 5  . aspirin 81 MG tablet Take 81 mg by mouth daily.    Marland Kitchen atorvastatin (LIPITOR) 40 MG tablet Take 1 tablet by mouth once daily 90 tablet 0  . cetirizine (ZYRTEC) 10 MG tablet Take 1 tablet by mouth once daily 30 tablet 3  . Fluticasone-Salmeterol (ADVAIR DISKUS) 250-50 MCG/DOSE AEPB Inhale 1 puff into the lungs 2 (two) times daily. 1 each 11  . ibuprofen (ADVIL,MOTRIN) 800 MG tablet Take 1 tablet (800 mg total) by mouth every 8 (eight) hours as needed for pain. 21 tablet 0  . omeprazole (PRILOSEC) 40 MG capsule Take 1 capsule (40 mg total) by mouth daily. 90 capsule 1  . omeprazole (PRILOSEC) 40 MG capsule Take 1 capsule by mouth once daily 90 capsule 0  . predniSONE (DELTASONE) 10 MG tablet 3 tabs by mouth per day for 3 days,2tabs per day for 3 days,1tab per day for 3 days 18 tablet 0  . sennosides-docusate sodium (SENOKOT-S) 8.6-50 MG tablet Take 1 tablet by mouth daily.    . Solriamfetol HCl (SUNOSI) 150 MG TABS Take 150 mg by mouth daily. 90 tablet 1  . triamcinolone (NASACORT) 55 MCG/ACT AERO nasal inhaler Place 2 sprays into the nose daily. 1 Inhaler 12  . valACYclovir (VALTREX) 1000 MG  tablet Take 1 tablet by mouth once daily 90 tablet 02   No current facility-administered medications on file prior to visit.   Review of Systems All otherwise neg per pt     Objective:   Physical Exam BP (!) 158/88   Pulse 80   Temp 98.3 F (36.8 C) (Oral)   Ht 5\' 10"  (1.778 m)   Wt 225 lb (102.1 kg)   SpO2 96%   BMI 32.28 kg/m  VS noted,  Constitutional: Pt appears in NAD HENT: Head: NCAT.  Right Ear: External ear normal.  Left Ear: External ear normal.  Eyes: . Pupils are equal, round, and reactive to light. Conjunctivae and EOM are normal Nose: without d/c or deformity Neck: Neck supple.  Gross normal ROM Cardiovascular: Normal rate and regular rhythm.   Pulmonary/Chest: Effort normal and breath sounds without rales or wheezing.  Abd:  Soft, NT, ND, + BS, no organomegaly Neurological: Pt is alert. At baseline orientation, motor grossly intact Skin: Skin is warm. no LE edema, but has whitish rash to the lateral feel bilateral Psychiatric: Pt behavior is normal without agitation  All otherwise neg per pt Lab Results  Component Value Date   WBC 6.6 01/16/2020   HGB 14.1 01/16/2020   HCT 41.7 01/16/2020   PLT 182.0 01/16/2020   GLUCOSE 118 (H) 01/16/2020   CHOL 140 01/16/2020   TRIG 117.0 01/16/2020   HDL 46.40 01/16/2020   LDLDIRECT 160.3 05/11/2009   LDLCALC 70 01/16/2020   ALT 37 01/16/2020   AST 24 01/16/2020   NA 140 01/16/2020   K 4.0 01/16/2020   CL 103 01/16/2020   CREATININE 1.23 01/16/2020   BUN 10 01/16/2020   CO2 28 01/16/2020   TSH 2.12 01/16/2020   PSA 0.70 01/16/2020   HGBA1C 7.5 (H) 01/16/2020   MICROALBUR 2.4 (H) 01/16/2020      Assessment & Plan:

## 2020-01-21 ENCOUNTER — Encounter: Payer: Self-pay | Admitting: Internal Medicine

## 2020-01-21 DIAGNOSIS — R21 Rash and other nonspecific skin eruption: Secondary | ICD-10-CM | POA: Insufficient documentation

## 2020-01-21 NOTE — Assessment & Plan Note (Signed)

## 2020-01-21 NOTE — Assessment & Plan Note (Signed)
Mild uncontrolled, for start metformin ER 500 qd

## 2020-01-21 NOTE — Assessment & Plan Note (Signed)
Likely fungal, for ketocon cream prn

## 2020-01-21 NOTE — Assessment & Plan Note (Signed)
Recent onset, for losartan 50 qd

## 2020-02-20 ENCOUNTER — Other Ambulatory Visit: Payer: Self-pay | Admitting: Internal Medicine

## 2020-02-28 ENCOUNTER — Other Ambulatory Visit: Payer: Self-pay

## 2020-02-28 MED ORDER — ATORVASTATIN CALCIUM 40 MG PO TABS
40.0000 mg | ORAL_TABLET | Freq: Every day | ORAL | 2 refills | Status: DC
Start: 2020-02-28 — End: 2020-04-16

## 2020-02-28 MED ORDER — VALACYCLOVIR HCL 1 G PO TABS
1000.0000 mg | ORAL_TABLET | Freq: Every day | ORAL | Status: DC
Start: 2020-02-28 — End: 2020-03-06

## 2020-02-28 MED ORDER — CETIRIZINE HCL 10 MG PO TABS
10.0000 mg | ORAL_TABLET | Freq: Every day | ORAL | 2 refills | Status: DC
Start: 2020-02-28 — End: 2020-03-19

## 2020-02-28 MED ORDER — METFORMIN HCL ER 500 MG PO TB24
500.0000 mg | ORAL_TABLET | Freq: Every day | ORAL | 3 refills | Status: DC
Start: 2020-02-28 — End: 2021-04-02

## 2020-03-05 ENCOUNTER — Other Ambulatory Visit: Payer: Self-pay | Admitting: Internal Medicine

## 2020-03-05 ENCOUNTER — Other Ambulatory Visit: Payer: Self-pay

## 2020-03-05 MED ORDER — LOSARTAN POTASSIUM 50 MG PO TABS
50.0000 mg | ORAL_TABLET | Freq: Every day | ORAL | 3 refills | Status: DC
Start: 2020-03-05 — End: 2020-07-12

## 2020-03-05 NOTE — Telephone Encounter (Signed)
Please refill as per office routine med refill policy (all routine meds refilled for 3 mo or monthly per pt preference up to one year from last visit, then month to month grace period for 3 mo, then further med refills will have to be denied)  

## 2020-03-19 ENCOUNTER — Other Ambulatory Visit: Payer: Self-pay | Admitting: Internal Medicine

## 2020-04-14 ENCOUNTER — Other Ambulatory Visit: Payer: Self-pay | Admitting: Internal Medicine

## 2020-04-15 NOTE — Telephone Encounter (Signed)
Please refill as per office routine med refill policy (all routine meds refilled for 3 mo or monthly per pt preference up to one year from last visit, then month to month grace period for 3 mo, then further med refills will have to be denied)  

## 2020-04-30 ENCOUNTER — Other Ambulatory Visit: Payer: Self-pay

## 2020-05-01 ENCOUNTER — Encounter: Payer: Self-pay | Admitting: Internal Medicine

## 2020-05-01 ENCOUNTER — Ambulatory Visit (INDEPENDENT_AMBULATORY_CARE_PROVIDER_SITE_OTHER): Payer: Medicare (Managed Care) | Admitting: Internal Medicine

## 2020-05-01 VITALS — BP 138/80 | HR 82 | Temp 98.5°F | Ht 70.0 in | Wt 226.0 lb

## 2020-05-01 DIAGNOSIS — N452 Orchitis: Secondary | ICD-10-CM

## 2020-05-01 DIAGNOSIS — I10 Essential (primary) hypertension: Secondary | ICD-10-CM

## 2020-05-01 DIAGNOSIS — E1165 Type 2 diabetes mellitus with hyperglycemia: Secondary | ICD-10-CM

## 2020-05-01 MED ORDER — CIPROFLOXACIN HCL 500 MG PO TABS: 500.0000 mg | ORAL_TABLET | Freq: Two times a day (BID) | ORAL | 0 refills | Status: AC

## 2020-05-01 NOTE — Assessment & Plan Note (Signed)
Lab Results  Component Value Date   HGBA1C 7.5 (H) 01/16/2020   Stable, pt to continue current medical treatment  - metformin  Current Outpatient Medications (Endocrine & Metabolic):  .  metFORMIN (GLUCOPHAGE-XR) 500 MG 24 hr tablet, Take 1 tablet (500 mg total) by mouth daily. .  predniSONE (DELTASONE) 10 MG tablet, 3 tabs by mouth per day for 3 days,2tabs per day for 3 days,1tab per day for 3 days (Patient not taking: Reported on 05/01/2020)  Current Outpatient Medications (Cardiovascular):  .  atorvastatin (LIPITOR) 40 MG tablet, Take 1 tablet by mouth once daily .  losartan (COZAAR) 50 MG tablet, Take 1 tablet (50 mg total) by mouth daily.  Current Outpatient Medications (Respiratory):  .  cetirizine (ZYRTEC) 10 MG tablet, Take 1 tablet by mouth once daily .  triamcinolone (NASACORT) 55 MCG/ACT AERO nasal inhaler, Place 2 sprays into the nose daily. Marland Kitchen  albuterol (VENTOLIN HFA) 108 (90 Base) MCG/ACT inhaler, Inhale 2 puffs into the lungs every 6 (six) hours as needed for wheezing or shortness of breath. (Patient not taking: Reported on 05/01/2020) .  Fluticasone-Salmeterol (ADVAIR DISKUS) 250-50 MCG/DOSE AEPB, Inhale 1 puff into the lungs 2 (two) times daily. (Patient not taking: Reported on 05/01/2020)  Current Outpatient Medications (Analgesics):  .  aspirin 81 MG tablet, Take 81 mg by mouth daily. Marland Kitchen  ibuprofen (ADVIL,MOTRIN) 800 MG tablet, Take 1 tablet (800 mg total) by mouth every 8 (eight) hours as needed for pain. (Patient not taking: Reported on 05/01/2020)   Current Outpatient Medications (Other):  .  ciprofloxacin (CIPRO) 500 MG tablet, Take 1 tablet (500 mg total) by mouth 2 (two) times daily for 10 days. Marland Kitchen  ketoconazole (NIZORAL) 2 % cream, Apply 1 application topically daily. Marland Kitchen  omeprazole (PRILOSEC) 40 MG capsule, Take 1 capsule (40 mg total) by mouth daily. .  sennosides-docusate sodium (SENOKOT-S) 8.6-50 MG tablet, Take 1 tablet by mouth daily. .  valACYclovir (VALTREX) 1000  MG tablet, Take 1 tablet by mouth once daily .  omeprazole (PRILOSEC) 40 MG capsule, Take 1 capsule by mouth once daily .  Solriamfetol HCl (SUNOSI) 150 MG TABS, Take 150 mg by mouth daily. (Patient not taking: Reported on 05/01/2020) .  zolpidem (AMBIEN) 10 MG tablet, Take 1 tablet (10 mg total) by mouth at bedtime as needed for sleep.

## 2020-05-01 NOTE — Progress Notes (Signed)
Patient ID: TKAI SERFASS, male   DOB: 04/26/1955, 65 y.o.   MRN: [id number]        Chief Complaint: left testicle pain swelling x 3 days, DM, HTN       HPI:  Lance [name] is a 65 y.o. male here with c/o above, without high fever, chills, trauma nad Denies urinary symptoms such as dysuria, frequency, urgency, flank pain, hematuria or n/v, fever, chills. Nothing makes better or worse.   Pt denies polydipsia, polyuria, Pt denies chest pain, increased sob or doe, wheezing, orthopnea, PND, increased LE swelling, palpitations, dizziness or syncope. .        Wt Readings from Last 3 Encounters:  05/01/20 226 lb (102.5 kg)  01/20/20 225 lb (102.1 kg)  07/15/19 231 lb (104.8 kg)   BP Readings from Last 3 Encounters:  05/01/20 138/80  01/20/20 (!) 158/88  07/15/19 (!) 160/80         Past Medical History:  Diagnosis Date  . ALLERGIC RHINITIS   . Allergy   . Constipation    uses Senna daily   . GERD (gastroesophageal reflux disease)   . HYPERLIPIDEMIA   . Impaired glucose tolerance   . OSTEOARTHRITIS, HANDS, BILATERAL    Past Surgical History:  Procedure Laterality Date  . COLONOSCOPY    . CYST EXCISION     neck and leg,drained and packed  . [place]   right  . POLYPECTOMY      reports that he has never smoked. He has never used smokeless tobacco. He reports that he does not drink alcohol and does not use drugs. family history includes Aneurysm in an other family member; Cancer in his sister; Diabetes in an other family member; Hypertension in an other family member; Hypothyroidism in an other family member; Kidney disease in his mother; Lung cancer in his brother and sister; Prostate cancer in his father. No Known Allergies Current Outpatient Medications on File Prior to Visit  Medication Sig Dispense Refill  . aspirin 81 MG tablet Take 81 mg by mouth daily.    Marland Kitchen atorvastatin (LIPITOR) 40 MG tablet Take 1 tablet by mouth once daily 90 tablet 0  .  cetirizine (ZYRTEC) 10 MG tablet Take 1 tablet by mouth once daily 30 tablet 0  . ketoconazole (NIZORAL) 2 % cream Apply 1 application topically daily. 30 g 1  . losartan (COZAAR) 50 MG tablet Take 1 tablet (50 mg total) by mouth daily. 90 tablet 3  . omeprazole (PRILOSEC) 40 MG capsule Take 1 capsule (40 mg total) by mouth daily. 90 capsule 1  . sennosides-docusate sodium (SENOKOT-S) 8.6-50 MG tablet Take 1 tablet by mouth daily.    Marland Kitchen triamcinolone (NASACORT) 55 MCG/ACT AERO nasal inhaler Place 2 sprays into the nose daily. 1 Inhaler 12  . valACYclovir (VALTREX) 1000 MG tablet Take 1 tablet by mouth once daily 90 tablet 0  . metFORMIN (GLUCOPHAGE-XR) 500 MG 24 hr tablet Take 1 tablet (500 mg total) by mouth daily. 90 tablet 3  . zolpidem (AMBIEN) 10 MG tablet Take 1 tablet (10 mg total) by mouth at bedtime as needed for sleep. 90 tablet 1   No current facility-administered medications on file prior to visit.        ROS:  All others reviewed and negative.  Objective        PE:  BP 138/80   Pulse 82   Temp 98.5 F (36.9 C) (Oral)   Ht 5\' 10"  (1.778  m)   Wt 226 lb (102.5 kg)   SpO2 96%   BMI 32.43 kg/m                 Constitutional: Pt appears in NAD               HENT: Head: NCAT.                Right Ear: External ear normal.                 Left Ear: External ear normal.                Eyes: . Pupils are equal, round, and reactive to light. Conjunctivae and EOM are normal               Nose: without d/c or deformity               Neck: Neck supple. Gross normal ROM               Cardiovascular: Normal rate and regular rhythm.                 Pulmonary/Chest: Effort normal and breath sounds without rales or wheezing.                Abd:  Soft, NT, ND, + BS, no organomegaly               Neurological: Pt is alert. At baseline orientation, motor grossly intact               Skin: Skin is warm. No rashes, no other new lesions, LE edema - none               Psychiatric: Pt behavior  is normal without agitation   Micro: none  Cardiac tracings I have personally interpreted today:  none  Pertinent Radiological findings (summarize): none   Lab Results  Component Value Date   WBC 6.6 01/16/2020   HGB 14.1 01/16/2020   HCT 41.7 01/16/2020   PLT 182.0 01/16/2020   GLUCOSE 118 (H) 01/16/2020   CHOL 140 01/16/2020   TRIG 117.0 01/16/2020   HDL 46.40 01/16/2020   LDLDIRECT 160.3 05/11/2009   LDLCALC 70 01/16/2020   ALT 37 01/16/2020   AST 24 01/16/2020   NA 140 01/16/2020   K 4.0 01/16/2020   CL 103 01/16/2020   CREATININE 1.23 01/16/2020   BUN 10 01/16/2020   CO2 28 01/16/2020   TSH 2.12 01/16/2020   PSA 0.70 01/16/2020   HGBA1C 7.5 (H) 01/16/2020   MICROALBUR 2.4 (H) 01/16/2020   Assessment/Plan:  Lance [name] is a 65 y.o. Black or African American [2] male with  has a past medical history of ALLERGIC RHINITIS, Allergy, Constipation, GERD (gastroesophageal reflux disease), HYPERLIPIDEMIA, Impaired glucose tolerance, and OSTEOARTHRITIS, HANDS, BILATERAL.  Orchitis Mild to mod, for antibx course - cipro,  to f/u any worsening symptoms or concerns -   Diabetes ([place]) Lab Results  Component Value Date   HGBA1C 7.5 (H) 01/16/2020   Stable, pt to continue current medical treatment  - metformin  Current Outpatient Medications (Endocrine & Metabolic):  .  metFORMIN (GLUCOPHAGE-XR) 500 MG 24 hr tablet, Take 1 tablet (500 mg total) by mouth daily. .  predniSONE (DELTASONE) 10 MG tablet, 3 tabs by mouth per day for 3 days,2tabs per day for 3 days,1tab per day for 3 days (Patient not taking: Reported on 05/01/2020)  Current Outpatient Medications (  Cardiovascular):  .  atorvastatin (LIPITOR) 40 MG tablet, Take 1 tablet by mouth once daily .  losartan (COZAAR) 50 MG tablet, Take 1 tablet (50 mg total) by mouth daily.  Current Outpatient Medications (Respiratory):  .  cetirizine (ZYRTEC) 10 MG tablet, Take 1 tablet by mouth once daily .  triamcinolone  (NASACORT) 55 MCG/ACT AERO nasal inhaler, Place 2 sprays into the nose daily. Marland Kitchen  albuterol (VENTOLIN HFA) 108 (90 Base) MCG/ACT inhaler, Inhale 2 puffs into the lungs every 6 (six) hours as needed for wheezing or shortness of breath. (Patient not taking: Reported on 05/01/2020) .  Fluticasone-Salmeterol (ADVAIR DISKUS) 250-50 MCG/DOSE AEPB, Inhale 1 puff into the lungs 2 (two) times daily. (Patient not taking: Reported on 05/01/2020)  Current Outpatient Medications (Analgesics):  .  aspirin 81 MG tablet, Take 81 mg by mouth daily. Marland Kitchen  ibuprofen (ADVIL,MOTRIN) 800 MG tablet, Take 1 tablet (800 mg total) by mouth every 8 (eight) hours as needed for pain. (Patient not taking: Reported on 05/01/2020)   Current Outpatient Medications (Other):  .  ciprofloxacin (CIPRO) 500 MG tablet, Take 1 tablet (500 mg total) by mouth 2 (two) times daily for 10 days. Marland Kitchen  ketoconazole (NIZORAL) 2 % cream, Apply 1 application topically daily. Marland Kitchen  omeprazole (PRILOSEC) 40 MG capsule, Take 1 capsule (40 mg total) by mouth daily. .  sennosides-docusate sodium (SENOKOT-S) 8.6-50 MG tablet, Take 1 tablet by mouth daily. .  valACYclovir (VALTREX) 1000 MG tablet, Take 1 tablet by mouth once daily .  omeprazole (PRILOSEC) 40 MG capsule, Take 1 capsule by mouth once daily .  Solriamfetol HCl (SUNOSI) 150 MG TABS, Take 150 mg by mouth daily. (Patient not taking: Reported on 05/01/2020) .  zolpidem (AMBIEN) 10 MG tablet, Take 1 tablet (10 mg total) by mouth at bedtime as needed for sleep.   HTN (hypertension) BP Readings from Last 3 Encounters:  05/01/20 138/80  01/20/20 (!) 158/88  07/15/19 (!) 160/80   Stable, pt to continue medical treatment  - losartan   Followup: Return if symptoms worsen or fail to improve.  Cathlean Cower, MD 05/01/2020 7:47 PM [facility]

## 2020-05-01 NOTE — Assessment & Plan Note (Signed)
BP Readings from Last 3 Encounters:  05/01/20 138/80  01/20/20 (!) 158/88  07/15/19 (!) 160/80   Stable, pt to continue medical treatment  - losartan

## 2020-05-01 NOTE — Assessment & Plan Note (Signed)
Mild to mod, for antibx course - cipro,  to f/u any worsening symptoms or concerns -

## 2020-05-01 NOTE — Patient Instructions (Signed)
Please take all new medication as prescribed - the antibiotic 

## 2020-05-07 ENCOUNTER — Ambulatory Visit: Payer: Medicare (Managed Care) | Admitting: Internal Medicine

## 2020-05-11 ENCOUNTER — Telehealth: Payer: Self-pay | Admitting: Internal Medicine

## 2020-05-11 DIAGNOSIS — N452 Orchitis: Secondary | ICD-10-CM

## 2020-05-11 DIAGNOSIS — N50812 Left testicular pain: Secondary | ICD-10-CM

## 2020-05-11 NOTE — Telephone Encounter (Signed)
Patient called and said that he is still experiencing an enlarged prostate and that ciprofloxacin (CIPRO) 500 MG tablet is not helping. He is requesting a call back at [phone]

## 2020-05-11 NOTE — Addendum Note (Signed)
Addended by: Biagio Borg on: 05/11/2020 04:47 PM   Modules accepted: Orders

## 2020-05-11 NOTE — Telephone Encounter (Signed)
Ok this is done 

## 2020-05-11 NOTE — Telephone Encounter (Signed)
I think he means an enlarged testicle that was present at his last visit  I can refer to Urology with the "urgent" if he wants, and also can try to get a scrotum u/s to see more detail about what is swollen , just let me know if this makes sense to him

## 2020-05-13 ENCOUNTER — Other Ambulatory Visit: Payer: Self-pay | Admitting: Internal Medicine

## 2020-05-14 ENCOUNTER — Telehealth: Payer: Self-pay | Admitting: Internal Medicine

## 2020-05-14 ENCOUNTER — Telehealth: Payer: Self-pay

## 2020-05-14 DIAGNOSIS — N50812 Left testicular pain: Secondary | ICD-10-CM

## 2020-05-14 DIAGNOSIS — E1165 Type 2 diabetes mellitus with hyperglycemia: Secondary | ICD-10-CM

## 2020-05-14 DIAGNOSIS — N452 Orchitis: Secondary | ICD-10-CM

## 2020-05-14 NOTE — Telephone Encounter (Addendum)
[facility] wants to know if the [place] needs to be with or without doppler [phone]

## 2020-05-14 NOTE — Telephone Encounter (Signed)
-----   Message from Carlynn Purl sent at 05/14/2020  9:40 AM EST ----- Location need changing to [place].

## 2020-05-14 NOTE — Telephone Encounter (Signed)
Patient notified of urology referral

## 2020-05-15 NOTE — Telephone Encounter (Signed)
Patient called and said that he is needing a new referral be sent to Dr. Grayce Sessions office for a urology appointment. He said that the place in [place], [place] that he was referred to did not accept his insurance. Please advise

## 2020-05-15 NOTE — Telephone Encounter (Signed)
Follow up message   [facility] calling to clarify order for ultrasound

## 2020-05-16 NOTE — Telephone Encounter (Signed)
Yes, ok for doppler

## 2020-05-17 ENCOUNTER — Telehealth: Payer: Self-pay

## 2020-05-17 NOTE — Telephone Encounter (Signed)
Ok referral done 

## 2020-05-17 NOTE — Addendum Note (Signed)
Addended by: Biagio Borg on: 05/17/2020 07:51 PM   Modules accepted: Orders

## 2020-05-17 NOTE — Telephone Encounter (Signed)
Left voicemail on Annye Asa with [facility] stating Dr. Dorma Russell the doopler.

## 2020-05-18 ENCOUNTER — Ambulatory Visit
Admission: RE | Admit: 2020-05-18 | Discharge: 2020-05-18 | Disposition: A | Discharge status: Home / Self Care | Payer: Medicare (Managed Care) | Source: Ambulatory Visit | Attending: Internal Medicine | Admitting: Internal Medicine

## 2020-05-18 DIAGNOSIS — N50812 Left testicular pain: Secondary | ICD-10-CM

## 2020-05-18 DIAGNOSIS — N452 Orchitis: Secondary | ICD-10-CM

## 2020-05-20 ENCOUNTER — Encounter: Payer: Self-pay | Admitting: Internal Medicine

## 2020-05-28 ENCOUNTER — Other Ambulatory Visit: Payer: Self-pay

## 2020-05-28 ENCOUNTER — Ambulatory Visit (INDEPENDENT_AMBULATORY_CARE_PROVIDER_SITE_OTHER): Payer: Medicare (Managed Care) | Admitting: Urology

## 2020-05-28 ENCOUNTER — Encounter: Payer: Self-pay | Admitting: Urology

## 2020-05-28 VITALS — BP 167/106 | HR 91 | Ht 70.0 in | Wt 222.0 lb

## 2020-05-28 DIAGNOSIS — N433 Hydrocele, unspecified: Secondary | ICD-10-CM | POA: Diagnosis not present

## 2020-05-28 NOTE — Progress Notes (Signed)
05/28/2020 10:56 AM   Lance [name] 01/23/1956 [id number]  Referring provider: Biagio Borg, MD [address] [place],  [place] [address]  Chief Complaint  Patient presents with  . Testicle Pain    HPI: Lance [name] is a 65 y.o. male referred for left scrotal swelling.   Saw PCP 05/01/2020 complaining of left scrotal pain and swelling x3 days  Treated with Cipro for orchitis  Call back after completed course complaining of persistent scrotal swelling  Scrotal ultrasound was ordered and performed 05/18/2020 which showed normal-appearing testes and paratesticular structures and large, bilateral hydroceles  He has minimal discomfort  Mild lower urinary tract symptoms of weak urinary stream  Denies dysuria, gross hematuria  PSA 01/16/2020 0.7   PMH: Past Medical History:  Diagnosis Date  . ALLERGIC RHINITIS   . Allergy   . Constipation    uses Senna daily   . GERD (gastroesophageal reflux disease)   . HYPERLIPIDEMIA   . Impaired glucose tolerance   . OSTEOARTHRITIS, HANDS, BILATERAL     Surgical History: Past Surgical History:  Procedure Laterality Date  . COLONOSCOPY    . CYST EXCISION     neck and leg,drained and packed  . [place]   right  . POLYPECTOMY      Home Medications:  Allergies as of 05/28/2020   No Known Allergies     Medication List       Accurate as of May 28, 2020 10:56 AM. If you have any questions, ask your nurse or doctor.        aspirin 81 MG tablet Take 81 mg by mouth daily.   atorvastatin 40 MG tablet Commonly known as: LIPITOR Take 1 tablet by mouth once daily   cetirizine 10 MG tablet Commonly known as: ZYRTEC Take 1 tablet by mouth once daily   ketoconazole 2 % cream Commonly known as: NIZORAL Apply 1 application topically daily.   losartan 50 MG tablet Commonly known as: COZAAR Take 1 tablet (50 mg total) by mouth daily.   metFORMIN 500 MG 24 hr tablet Commonly known as:  GLUCOPHAGE-XR Take 1 tablet (500 mg total) by mouth daily.   omeprazole 40 MG capsule Commonly known as: PRILOSEC Take 1 capsule (40 mg total) by mouth daily.   sennosides-docusate sodium 8.6-50 MG tablet Commonly known as: SENOKOT-S Take 1 tablet by mouth daily.   triamcinolone 55 MCG/ACT Aero nasal inhaler Commonly known as: NASACORT Place 2 sprays into the nose daily.   valACYclovir 1000 MG tablet Commonly known as: VALTREX Take 1 tablet by mouth once daily   zolpidem 10 MG tablet Commonly known as: AMBIEN Take 1 tablet (10 mg total) by mouth at bedtime as needed for sleep.       Allergies: No Known Allergies  Family History: Family History  Problem Relation Age of Onset  . Hypertension Other   . Diabetes Other   . Hypothyroidism Other   . Aneurysm Other   . Prostate cancer Father   . Cancer Sister        unsure of type   . Lung cancer Brother   . Lung cancer Sister   . Kidney disease Mother   . Colon cancer Neg Hx   . Stomach cancer Neg Hx   . Colon polyps Neg Hx   . Esophageal cancer Neg Hx   . Rectal cancer Neg Hx     Social History:  reports that he has never smoked. He has  never used smokeless tobacco. He reports that he does not drink alcohol and does not use drugs.   Physical Exam: BP (!) 167/106   Pulse 91   Ht 5\' 10"  (1.778 m)   Wt 222 lb (100.7 kg)   BMI 31.85 kg/m   Constitutional:  Alert and oriented, No acute distress. HEENT: [place] AT, moist mucus membranes.  Trachea midline, no masses. Cardiovascular: No clubbing, cyanosis, or edema. Respiratory: Normal respiratory effort, no increased work of breathing. GI: Abdomen is soft, nontender, nondistended, no abdominal masses GU: Phallus without lesion; right testis palpably normal and no significant hydrocele noted.  Moderate to large left hydrocele and left testis not palpable Skin: No rashes, bruises or suspicious lesions. Neurologic: Grossly intact, no focal deficits, moving all 4  extremities. Psychiatric: Normal mood and affect.  Laboratory Data:  Urinalysis Dipstick/microscopy negative   Pertinent Imaging: Scrotal ultrasound images were personally reviewed and interpreted  Assessment & Plan:    1.  Left hydrocele  Minimally symptomatic moderate to large left hydrocele  Management options were discussed including surveillance, aspiration w/wo sclerotherapy and hydrocelectomy  Hydrocelectomy was discussed in detail  He has elected surveillance but would like a follow-up visit in 6 months for recheck   Abbie Sons, [place] [address] [place], [place] [address] [phone]

## 2020-05-30 LAB — URINALYSIS, COMPLETE
Bilirubin, UA: NEGATIVE
Glucose, UA: NEGATIVE
Ketones, UA: NEGATIVE
Leukocytes,UA: NEGATIVE
Nitrite, UA: NEGATIVE
Protein,UA: NEGATIVE
RBC, UA: NEGATIVE
Specific Gravity, UA: 1.02 (ref 1.005–1.030)
Urobilinogen, Ur: 0.2 mg/dL (ref 0.2–1.0)
pH, UA: 6.5 (ref 5.0–7.5)

## 2020-05-30 LAB — MICROSCOPIC EXAMINATION: Bacteria, UA: NONE SEEN

## 2020-06-16 ENCOUNTER — Other Ambulatory Visit: Payer: Self-pay | Admitting: Internal Medicine

## 2020-07-07 ENCOUNTER — Other Ambulatory Visit: Payer: Self-pay | Admitting: Internal Medicine

## 2020-07-07 NOTE — Telephone Encounter (Signed)
Please refill as per office routine med refill policy (all routine meds refilled for 3 mo or monthly per pt preference up to one year from last visit, then month to month grace period for 3 mo, then further med refills will have to be denied)  

## 2020-07-09 ENCOUNTER — Other Ambulatory Visit (INDEPENDENT_AMBULATORY_CARE_PROVIDER_SITE_OTHER): Payer: Medicare (Managed Care)

## 2020-07-09 DIAGNOSIS — E119 Type 2 diabetes mellitus without complications: Secondary | ICD-10-CM

## 2020-07-09 LAB — BASIC METABOLIC PANEL
BUN: 11 mg/dL (ref 6–23)
CO2: 31 mEq/L (ref 19–32)
Calcium: 9.3 mg/dL (ref 8.4–10.5)
Chloride: 104 mEq/L (ref 96–112)
Creatinine, Ser: 1.24 mg/dL (ref 0.40–1.50)
GFR: 60.37 mL/min (ref >60.00)
Glucose, Bld: 129 mg/dL — ABNORMAL HIGH (ref 70–99)
Potassium: 4 mEq/L (ref 3.5–5.1)
Sodium: 140 mEq/L (ref 135–145)

## 2020-07-09 LAB — HEPATIC FUNCTION PANEL
ALT: 37 U/L (ref 0–53)
AST: 26 U/L (ref 0–37)
Albumin: 4.2 g/dL (ref 3.5–5.2)
Alkaline Phosphatase: 42 U/L (ref 39–117)
Bilirubin, Direct: 0.2 mg/dL (ref 0.0–0.3)
Total Bilirubin: 1.1 mg/dL (ref 0.2–1.2)
Total Protein: 6.6 g/dL (ref 6.0–8.3)

## 2020-07-09 LAB — HEMOGLOBIN A1C: Hgb A1c MFr Bld: 6.7 % — ABNORMAL HIGH (ref 4.6–6.5)

## 2020-07-09 LAB — LIPID PANEL
Cholesterol: 114 mg/dL (ref 0–200)
HDL: 42.9 mg/dL (ref >39.00)
NonHDL: 71.06
Total CHOL/HDL Ratio: 3
Triglycerides: 234 mg/dL — ABNORMAL HIGH (ref 0.0–149.0)
VLDL: 46.8 mg/dL — ABNORMAL HIGH (ref 0.0–40.0)

## 2020-07-09 LAB — LDL CHOLESTEROL, DIRECT: Direct LDL: 54 mg/dL

## 2020-07-11 ENCOUNTER — Other Ambulatory Visit: Payer: Self-pay

## 2020-07-12 ENCOUNTER — Ambulatory Visit (INDEPENDENT_AMBULATORY_CARE_PROVIDER_SITE_OTHER): Payer: Medicare (Managed Care) | Admitting: Internal Medicine

## 2020-07-12 VITALS — BP 146/82 | HR 83 | Temp 98.3°F | Ht 70.0 in | Wt 223.8 lb

## 2020-07-12 DIAGNOSIS — E559 Vitamin D deficiency, unspecified: Secondary | ICD-10-CM

## 2020-07-12 DIAGNOSIS — F5101 Primary insomnia: Secondary | ICD-10-CM

## 2020-07-12 DIAGNOSIS — Z0000 Encounter for general adult medical examination without abnormal findings: Secondary | ICD-10-CM

## 2020-07-12 DIAGNOSIS — I10 Essential (primary) hypertension: Secondary | ICD-10-CM

## 2020-07-12 DIAGNOSIS — E782 Mixed hyperlipidemia: Secondary | ICD-10-CM

## 2020-07-12 DIAGNOSIS — E1165 Type 2 diabetes mellitus with hyperglycemia: Secondary | ICD-10-CM

## 2020-07-12 MED ORDER — ZOLPIDEM TARTRATE 10 MG PO TABS: 10.0000 mg | ORAL_TABLET | Freq: Every evening | ORAL | 1 refills | Status: DC | PRN

## 2020-07-12 MED ORDER — LOSARTAN POTASSIUM 100 MG PO TABS: 100.0000 mg | ORAL_TABLET | Freq: Every day | ORAL | 3 refills | Status: DC

## 2020-07-12 NOTE — Patient Instructions (Signed)
Ok to increase the losartan to 100 mg per day  Please continue all other medications as before, and refills have been done if requested.  Please have the pharmacy call with any other refills you may need.  Please continue your efforts at being more active, low cholesterol diet, and weight control.  You are otherwise up to date with prevention measures today.  Please keep your appointments with your specialists as you may have planned  Please make an Appointment to return in 6 months, or sooner if needed, also with Lab Appointment for testing done 3-5 days before at the [facility] (so this is for TWO appointments - please see the scheduling desk as you leave)  Due to the ongoing Covid 19 pandemic, our lab now requires an appointment for any labs done at our office.  If you need labs done and do not have an appointment, please call our office ahead of time to schedule before presenting to the lab for your testing.

## 2020-07-12 NOTE — Progress Notes (Signed)
Patient ID: Lance [name], male   DOB: 1956-02-21, 65 y.o.   MRN: [id number]        Chief Complaint: follow up HTN, HLD and hyperglycemia , insomnia       HPI:  Lance [name] is a 65 y.o. male here with c/o above, current [place] doing well and needs refill.  Pt denies chest pain, increased sob or doe, wheezing, orthopnea, PND, increased LE swelling, palpitations, dizziness or syncope.   Pt denies polydipsia, polyuria, denies new worsening focal neuro s/s.   Pt denies fever, wt loss, night sweats, loss of appetite, or other constitutional symptoms       Wt Readings from Last 3 Encounters:  07/12/20 223 lb 12.8 oz (101.5 kg)  05/28/20 222 lb (100.7 kg)  05/01/20 226 lb (102.5 kg)   BP Readings from Last 3 Encounters:  07/12/20 (!) 146/82  05/28/20 (!) 167/106  05/01/20 138/80         Past Medical History:  Diagnosis Date  . ALLERGIC RHINITIS   . Allergy   . Constipation    uses Senna daily   . GERD (gastroesophageal reflux disease)   . HYPERLIPIDEMIA   . Impaired glucose tolerance   . OSTEOARTHRITIS, HANDS, BILATERAL    Past Surgical History:  Procedure Laterality Date  . COLONOSCOPY    . CYST EXCISION     neck and leg,drained and packed  . [place]   right  . POLYPECTOMY      reports that he has never smoked. He has never used smokeless tobacco. He reports that he does not drink alcohol and does not use drugs. family history includes Aneurysm in an other family member; Cancer in his sister; Diabetes in an other family member; Hypertension in an other family member; Hypothyroidism in an other family member; Kidney disease in his mother; Lung cancer in his brother and sister; Prostate cancer in his father. No Known Allergies Current Outpatient Medications on File Prior to Visit  Medication Sig Dispense Refill  . aspirin 81 MG tablet Take 81 mg by mouth daily.    Marland Kitchen atorvastatin (LIPITOR) 40 MG tablet Take 1 tablet by mouth once daily 90 tablet 0   . cetirizine (ZYRTEC) 10 MG tablet Take 1 tablet by mouth once daily 90 tablet 3  . ketoconazole (NIZORAL) 2 % cream Apply 1 application topically daily. 30 g 1  . metFORMIN (GLUCOPHAGE-XR) 500 MG 24 hr tablet Take 1 tablet (500 mg total) by mouth daily. 90 tablet 3  . omeprazole (PRILOSEC) 40 MG capsule Take 1 capsule by mouth once daily 90 capsule 0  . sennosides-docusate sodium (SENOKOT-S) 8.6-50 MG tablet Take 1 tablet by mouth daily.    Marland Kitchen triamcinolone (NASACORT) 55 MCG/ACT AERO nasal inhaler Place 2 sprays into the nose daily. 1 Inhaler 12  . valACYclovir (VALTREX) 1000 MG tablet Take 1 tablet by mouth once daily 90 tablet 3   No current facility-administered medications on file prior to visit.        ROS:  All others reviewed and negative.  Objective        PE:  BP (!) 146/82 (BP Location: Left Arm, Patient Position: Sitting, Cuff Size: Large)   Pulse 83   Temp 98.3 F (36.8 C) (Oral)   Ht 5\' 10"  (1.778 m)   Wt 223 lb 12.8 oz (101.5 kg)   SpO2 97%   BMI 32.11 kg/m  Constitutional: Pt appears in NAD               HENT: Head: NCAT.                Right Ear: External ear normal.                 Left Ear: External ear normal.                Eyes: . Pupils are equal, round, and reactive to light. Conjunctivae and EOM are normal               Nose: without d/c or deformity               Neck: Neck supple. Gross normal ROM               Cardiovascular: Normal rate and regular rhythm.                 Pulmonary/Chest: Effort normal and breath sounds without rales or wheezing.                Abd:  Soft, NT, ND, + BS, no organomegaly               Neurological: Pt is alert. At baseline orientation, motor grossly intact               Skin: Skin is warm. No rashes, no other new lesions, LE edema - none               Psychiatric: Pt behavior is normal without agitation   Micro: none  Cardiac tracings I have personally interpreted today:  none  Pertinent  Radiological findings (summarize): none   Lab Results  Component Value Date   WBC 6.6 01/16/2020   HGB 14.1 01/16/2020   HCT 41.7 01/16/2020   PLT 182.0 01/16/2020   GLUCOSE 129 (H) 07/09/2020   CHOL 114 07/09/2020   TRIG 234.0 (H) 07/09/2020   HDL 42.90 07/09/2020   LDLDIRECT 54.0 07/09/2020   LDLCALC 70 01/16/2020   ALT 37 07/09/2020   AST 26 07/09/2020   NA 140 07/09/2020   K 4.0 07/09/2020   CL 104 07/09/2020   CREATININE 1.24 07/09/2020   BUN 11 07/09/2020   CO2 31 07/09/2020   TSH 2.12 01/16/2020   PSA 0.70 01/16/2020   HGBA1C 6.7 (H) 07/09/2020   MICROALBUR 2.4 (H) 01/16/2020   Assessment/Plan:  Lance [name] is a 65 y.o. Black or African American [2] male with  has a past medical history of ALLERGIC RHINITIS, Allergy, Constipation, GERD (gastroesophageal reflux disease), HYPERLIPIDEMIA, Impaired glucose tolerance, and OSTEOARTHRITIS, HANDS, BILATERAL.  Hypertension, uncontrolled BP Readings from Last 3 Encounters:  07/12/20 (!) 146/82  05/28/20 (!) 167/106  05/01/20 138/80   Stable, pt to continue medical treatment  - for increased losartan 100 qd, f/u bp at home and next visit   Hyperlipidemia Lab Results  Component Value Date   [place] 70 01/16/2020   Stable, pt to continue current statin lipitor 40    Diabetes ([place]) Lab Results  Component Value Date   HGBA1C 6.7 (H) 07/09/2020   Stable, pt to continue current medical treatment metformin   Insomnia Stable, for med refill  Followup: Return in about 6 months (around 01/11/2021).  Cathlean Cower, MD 07/15/2020 2:45 PM [facility]

## 2020-07-15 ENCOUNTER — Encounter: Payer: Self-pay | Admitting: Internal Medicine

## 2020-07-15 NOTE — Assessment & Plan Note (Signed)
BP Readings from Last 3 Encounters:  07/12/20 (!) 146/82  05/28/20 (!) 167/106  05/01/20 138/80   Stable, pt to continue medical treatment  - for increased losartan 100 qd, f/u bp at home and next visit

## 2020-07-15 NOTE — Assessment & Plan Note (Signed)
Stable, for med refill 

## 2020-07-15 NOTE — Assessment & Plan Note (Signed)
Lab Results  Component Value Date   LDLCALC 70 01/16/2020   Stable, pt to continue current statin lipitor 40

## 2020-07-15 NOTE — Assessment & Plan Note (Signed)
Lab Results  Component Value Date   HGBA1C 6.7 (H) 07/09/2020   Stable, pt to continue current medical treatment metformin

## 2020-07-20 ENCOUNTER — Ambulatory Visit: Payer: Medicare (Managed Care) | Admitting: Internal Medicine

## 2020-09-07 NOTE — Telephone Encounter (Signed)
   Patient calling to report [facility] does not take insurance

## 2020-09-14 ENCOUNTER — Other Ambulatory Visit: Payer: Self-pay

## 2020-09-14 ENCOUNTER — Encounter: Payer: Self-pay | Admitting: Urology

## 2020-09-14 ENCOUNTER — Ambulatory Visit: Payer: Medicare (Managed Care) | Admitting: Urology

## 2020-09-14 VITALS — BP 128/74 | HR 80 | Ht 70.0 in | Wt 221.0 lb

## 2020-09-14 DIAGNOSIS — N433 Hydrocele, unspecified: Secondary | ICD-10-CM

## 2020-09-14 NOTE — Progress Notes (Signed)
09/14/2020 2:06 PM   Lance [name] 11/15/1955 [id number]  Referring provider: Biagio Borg, MD [address] [place],  [place] [name] [id number]  Chief Complaint  Patient presents with   Hydrocele    HPI: 65 y.o. male scheduled a follow-up appointment due to an enlarging left hydrocele.  Seen 05/28/2020 for a moderate to large left hydrocele with minimal discomfort He declined treatment and elected surveillance and was scheduled for a 67-month follow-up however has noted enlargement with increasing discomfort which is described as a dull ache No bothersome LUTS   PMH: Past Medical History:  Diagnosis Date   ALLERGIC RHINITIS    Allergy    Constipation    uses Senna daily    GERD (gastroesophageal reflux disease)    HYPERLIPIDEMIA    Impaired glucose tolerance    OSTEOARTHRITIS, HANDS, BILATERAL     Surgical History: Past Surgical History:  Procedure Laterality Date   COLONOSCOPY     CYST EXCISION     neck and leg,drained and packed   INGUINAL HERNIA REPAIR  1995   right   POLYPECTOMY      Home Medications:  Allergies as of 09/14/2020   No Known Allergies      Medication List        Accurate as of September 14, 2020  2:06 PM. If you have any questions, ask your nurse or doctor.          aspirin 81 MG tablet Take 81 mg by mouth daily.   atorvastatin 40 MG tablet Commonly known as: LIPITOR Take 1 tablet by mouth once daily   cetirizine 10 MG tablet Commonly known as: ZYRTEC Take 1 tablet by mouth once daily   ketoconazole 2 % cream Commonly known as: NIZORAL Apply 1 application topically daily.   losartan 100 MG tablet Commonly known as: COZAAR Take 1 tablet (100 mg total) by mouth daily.   metFORMIN 500 MG 24 hr tablet Commonly known as: GLUCOPHAGE-XR Take 1 tablet (500 mg total) by mouth daily.   omeprazole 40 MG capsule Commonly known as: PRILOSEC Take 1 capsule by mouth once daily   sennosides-docusate sodium 8.6-50 MG  tablet Commonly known as: SENOKOT-S Take 1 tablet by mouth daily.   triamcinolone 55 MCG/ACT Aero nasal inhaler Commonly known as: NASACORT Place 2 sprays into the nose daily.   valACYclovir 1000 MG tablet Commonly known as: VALTREX Take 1 tablet by mouth once daily   zolpidem 10 MG tablet Commonly known as: AMBIEN Take 1 tablet (10 mg total) by mouth at bedtime as needed for sleep.        Allergies: No Known Allergies  Family History: Family History  Problem Relation Age of Onset   Hypertension Other    Diabetes Other    Hypothyroidism Other    Aneurysm Other    Prostate cancer Father    Cancer Sister        unsure of type    Lung cancer Brother    Lung cancer Sister    Kidney disease Mother    Colon cancer Neg Hx    Stomach cancer Neg Hx    Colon polyps Neg Hx    Esophageal cancer Neg Hx    Rectal cancer Neg Hx     Social History:  reports that he has never smoked. He has never used smokeless tobacco. He reports that he does not drink alcohol and does not use drugs.   Physical Exam: BP 128/74   Pulse 80  Ht 5\' 10"  (1.778 m)   Wt 221 lb (100.2 kg)   BMI 31.71 kg/m   Constitutional:  Alert and oriented, No acute distress. HEENT: [place] AT, moist mucus membranes.  Trachea midline, no masses. Cardiovascular: No clubbing, cyanosis, or edema. Respiratory: Normal respiratory effort, no increased work of breathing. GI: Abdomen is soft, nontender, nondistended, no abdominal masses GU: Phallus without lesions, right testis descended and palpably normal large left hydrocele which has increased in size from prior exam.  No evidence of inguinal hernia Skin: No rashes, bruises or suspicious lesions. Neurologic: Grossly intact, no focal deficits, moving all 4 extremities. Psychiatric: Normal mood and affect.   Assessment & Plan:    1.  Left hydrocele Enlarging and has become symptomatic We again reviewed options including surveillance, aspiration with or without  sclerotherapy and hydrocelectomy He would like to schedule hydrocelectomy sometime in August after his vacation in July.  The procedure was discussed in detail including potential risks of bleeding/hematoma, infection/abscess, scarring and chronic scrotal pain.  All questions were answered and he desires to schedule.   Abbie Sons, [place] [address] [place],  [address] [phone]

## 2020-09-20 ENCOUNTER — Other Ambulatory Visit: Payer: Self-pay | Admitting: *Deleted

## 2020-09-20 DIAGNOSIS — N433 Hydrocele, unspecified: Secondary | ICD-10-CM

## 2020-09-25 ENCOUNTER — Telehealth: Payer: Self-pay | Admitting: Urology

## 2020-09-25 NOTE — Telephone Encounter (Signed)
Patient was seen by [facility] on 6/24 and discussed surgery.  He initially thought that we would like to put it off until August.   Patient contacted the office on 09/21/20 wanting to move forward with scheduling the surgery as soon as possible.  Please contact the patient to discuss.

## 2020-09-27 ENCOUNTER — Other Ambulatory Visit: Payer: Self-pay | Admitting: *Deleted

## 2020-09-27 NOTE — Telephone Encounter (Signed)
Called and left message for patient to return call to schedule surgery. Next available date will be 10-16-20, patient is on ASA need to confirm no Cardiac Clearance needed

## 2020-09-27 NOTE — Telephone Encounter (Signed)
Patient called back and confirmed surgery date 10-16-20. Patient states he does not have any cardiac history and is only taking ASA as preventive. He was instructed to stop ASA 7 days prior to surgery, NPO midnight on, and have a driver. PAT will call for appt and pre-registration. Surgery number was given to call the day before for arrival time and a reminder was mailed to the patient.

## 2020-10-05 ENCOUNTER — Other Ambulatory Visit: Payer: Self-pay | Admitting: Internal Medicine

## 2020-10-05 NOTE — Telephone Encounter (Signed)
Please refill as per office routine med refill policy (all routine meds refilled for 3 mo or monthly per pt preference up to one year from last visit, then month to month grace period for 3 mo, then further med refills will have to be denied)  

## 2020-10-09 ENCOUNTER — Other Ambulatory Visit: Payer: Self-pay

## 2020-10-09 ENCOUNTER — Other Ambulatory Visit
Admission: RE | Admit: 2020-10-09 | Discharge: 2020-10-09 | Disposition: A | Discharge status: Home / Self Care | Payer: Medicare (Managed Care) | Source: Ambulatory Visit | Attending: Urology | Admitting: Urology

## 2020-10-09 HISTORY — DX: Prediabetes: R73.03

## 2020-10-09 HISTORY — DX: Essential (primary) hypertension: I10

## 2020-10-09 NOTE — Patient Instructions (Addendum)
Your procedure is scheduled on: 10/16/20 - Tuesday Report to the Registration Desk on the 1st floor of the [facility]. To find out your arrival time, please call [phone] between 1PM - 3PM on: 10/15/20 - Monday Report to [facility] on 10/12/20 at 8:00 am for Labs and EKG.  REMEMBER: Instructions that are not followed completely may result in serious medical risk, up to and including death; or upon the discretion of your surgeon and anesthesiologist your surgery may need to be rescheduled.  Do not eat food or drink any fluids after midnight the night before surgery.  No gum chewing, lozengers or hard candies.  TAKE THESE MEDICATIONS THE MORNING OF SURGERY WITH A SIP OF WATER:  - atorvastatin (LIPITOR) 40 MG tablet - omeprazole (PRILOSEC) 40 MG capsule - triamcinolone (NASACORT) 55 MCG/ACT AERO nasal inhaler - valACYclovir (VALTREX) 1000 MG tablet   Stop Metformin 2 days prior to surgery. Do not take 07/24, 07/25, and do not take the day of surgery.  Follow recommendations from Cardiologist, Pulmonologist or PCP regarding stopping Aspirin, Coumadin, Plavix, Eliquis, Pradaxa, or Pletal. Stop Aspirin 81 mg beginning 10/10/20.  One week prior to surgery: Stop Anti-inflammatories (NSAIDS) such as Advil, Aleve, Ibuprofen, Motrin, Naproxen, Naprosyn and Aspirin based products such as Excedrin, Goodys Powder, BC Powder.  Stop ANY OVER THE COUNTER supplements until after surgery.  You may however, continue to take Tylenol if needed for pain up until the day of surgery.  No Alcohol for 24 hours before or after surgery.  No Smoking including e-cigarettes for 24 hours prior to surgery.  No chewable tobacco products for at least 6 hours prior to surgery.  No nicotine patches on the day of surgery.  Do not use any "recreational" drugs for at least a week prior to your surgery.  Please be advised that the combination of cocaine and anesthesia may have negative outcomes, up to and  including death. If you test positive for cocaine, your surgery will be cancelled.  On the morning of surgery brush your teeth with toothpaste and water, you may rinse your mouth with mouthwash if you wish. Do not swallow any toothpaste or mouthwash.  Do not wear jewelry, make-up, hairpins, clips or nail polish.  Do not wear lotions, powders, or perfumes.   Do not shave body from the neck down 48 hours prior to surgery just in case you cut yourself which could leave a site for infection.  Also, freshly shaved skin may become irritated if using the CHG soap.  Contact lenses, hearing aids and dentures may not be worn into surgery.  Do not bring valuables to the hospital. [facility] is not responsible for any missing/lost belongings or valuables.   Use CHG Soap or wipes as directed on instruction sheet.  Notify your doctor if there is any change in your medical condition (cold, fever, infection).  Wear comfortable clothing (specific to your surgery type) to the hospital.  After surgery, you can help prevent lung complications by doing breathing exercises.  Take deep breaths and cough every 1-2 hours. Your doctor may order a device called an Incentive Spirometer to help you take deep breaths. When coughing or sneezing, hold a pillow firmly against your incision with both hands. This is called "splinting." Doing this helps protect your incision. It also decreases belly discomfort.  If you are being admitted to the hospital overnight, leave your suitcase in the car. After surgery it may be brought to your room.  If you are  being discharged the day of surgery, you will not be allowed to drive home. You will need a responsible adult (18 years or older) to drive you home and stay with you that night.   If you are taking public transportation, you will need to have a responsible adult (18 years or older) with you. Please confirm with your physician that it is acceptable to use public  transportation.   Please call the [facility] at [phone] if you have any questions about these instructions.  Surgery Visitation Policy:  Patients undergoing a surgery or procedure may have one family member or support person with them as long as that person is not COVID-19 positive or experiencing its symptoms.  That person may remain in the waiting area during the procedure.  Inpatient Visitation:    Visiting hours are 7 a.m. to 8 p.m. Inpatients will be allowed two visitors daily. The visitors may change each day during the patient's stay. No visitors under the age of 61. Any visitor under the age of 68 must be accompanied by an adult. The visitor must pass COVID-19 screenings, use hand sanitizer when entering and exiting the patient's room and wear a mask at all times, including in the patient's room. Patients must also wear a mask when staff or their visitor are in the room. Masking is required regardless of vaccination status.

## 2020-10-09 NOTE — Pre-Procedure Instructions (Addendum)
Patients pre admission interview completed with the help of his wife, Jahmari Esbenshade, patient not able to complete interview due to work issue.

## 2020-10-12 ENCOUNTER — Other Ambulatory Visit
Admission: RE | Admit: 2020-10-12 | Discharge: 2020-10-12 | Disposition: A | Discharge status: Home / Self Care | Payer: Medicare (Managed Care) | Source: Ambulatory Visit | Attending: Urology | Admitting: Urology

## 2020-10-12 ENCOUNTER — Other Ambulatory Visit: Payer: Self-pay

## 2020-10-12 DIAGNOSIS — Z01818 Encounter for other preprocedural examination: Secondary | ICD-10-CM | POA: Insufficient documentation

## 2020-10-12 LAB — CBC
HCT: 39.6 % (ref 39.0–52.0)
Hemoglobin: 13.3 g/dL (ref 13.0–17.0)
MCH: 30.6 pg (ref 26.0–34.0)
MCHC: 33.6 g/dL (ref 30.0–36.0)
MCV: 91 fL (ref 80.0–100.0)
Platelets: 162 10*3/uL (ref 150–400)
RBC: 4.35 MIL/uL (ref 4.22–5.81)
RDW: 12.5 % (ref 11.5–15.5)
WBC: 6.6 10*3/uL (ref 4.0–10.5)
nRBC: 0 % (ref 0.0–0.2)

## 2020-10-12 LAB — BASIC METABOLIC PANEL
Anion gap: 5 (ref 5–15)
BUN: 20 mg/dL (ref 8–23)
CO2: 29 mmol/L (ref 22–32)
Calcium: 9 mg/dL (ref 8.9–10.3)
Chloride: 105 mmol/L (ref 98–111)
Creatinine, Ser: 1.27 mg/dL — ABNORMAL HIGH (ref 0.61–1.24)
GFR, Estimated: >60 mL/min (ref >60)
Glucose, Bld: 120 mg/dL — ABNORMAL HIGH (ref 70–99)
Potassium: 3.8 mmol/L (ref 3.5–5.1)
Sodium: 139 mmol/L (ref 135–145)

## 2020-10-16 ENCOUNTER — Other Ambulatory Visit: Payer: Self-pay

## 2020-10-16 ENCOUNTER — Ambulatory Visit: Payer: Medicare (Managed Care) | Admitting: Anesthesiology

## 2020-10-16 ENCOUNTER — Ambulatory Visit
Admission: RE | Admit: 2020-10-16 | Discharge: 2020-10-16 | Disposition: A | Discharge status: Home / Self Care | Payer: Medicare (Managed Care) | Attending: Urology | Admitting: Urology

## 2020-10-16 ENCOUNTER — Encounter
Admission: RE | Disposition: A | Discharge status: Home / Self Care | Payer: Self-pay | Source: Home / Self Care | Attending: Urology

## 2020-10-16 ENCOUNTER — Encounter: Payer: Self-pay | Admitting: Urology

## 2020-10-16 DIAGNOSIS — I10 Essential (primary) hypertension: Secondary | ICD-10-CM | POA: Insufficient documentation

## 2020-10-16 DIAGNOSIS — E119 Type 2 diabetes mellitus without complications: Secondary | ICD-10-CM | POA: Insufficient documentation

## 2020-10-16 DIAGNOSIS — Z79899 Other long term (current) drug therapy: Secondary | ICD-10-CM | POA: Diagnosis not present

## 2020-10-16 DIAGNOSIS — Z7984 Long term (current) use of oral hypoglycemic drugs: Secondary | ICD-10-CM | POA: Insufficient documentation

## 2020-10-16 DIAGNOSIS — N433 Hydrocele, unspecified: Secondary | ICD-10-CM | POA: Insufficient documentation

## 2020-10-16 HISTORY — PX: HYDROCELE EXCISION: SHX482

## 2020-10-16 LAB — GLUCOSE, CAPILLARY: Glucose-Capillary: 171 mg/dL — ABNORMAL HIGH (ref 70–99)

## 2020-10-16 SURGERY — HYDROCELECTOMY
Anesthesia: General | Site: Groin | Laterality: Left

## 2020-10-16 MED ORDER — DEXAMETHASONE SODIUM PHOSPHATE 10 MG/ML IJ SOLN
INTRAMUSCULAR | Status: DC | PRN
  Administered 2020-10-16: 5 mg via INTRAVENOUS

## 2020-10-16 MED ORDER — FENTANYL CITRATE (PF) 100 MCG/2ML IJ SOLN
INTRAMUSCULAR | Status: AC
  Filled 2020-10-16: qty 2

## 2020-10-16 MED ORDER — PROPOFOL 10 MG/ML IV BOLUS
INTRAVENOUS | Status: DC | PRN
  Administered 2020-10-16: 200 mg via INTRAVENOUS
  Administered 2020-10-16: 50 mg via INTRAVENOUS

## 2020-10-16 MED ORDER — ACETAMINOPHEN 10 MG/ML IV SOLN
INTRAVENOUS | Status: DC | PRN
  Administered 2020-10-16: 1000 mg via INTRAVENOUS

## 2020-10-16 MED ORDER — ORAL CARE MOUTH RINSE: 15.0000 mL | Freq: Once | OROMUCOSAL | Status: AC

## 2020-10-16 MED ORDER — HYDROCODONE-ACETAMINOPHEN 7.5-325 MG PO TABS
ORAL_TABLET | ORAL | Status: AC
  Administered 2020-10-16: 1 via ORAL
  Filled 2020-10-16: qty 1

## 2020-10-16 MED ORDER — BUPIVACAINE HCL 0.5 % IJ SOLN
INTRAMUSCULAR | Status: DC | PRN
  Administered 2020-10-16: 5 mL

## 2020-10-16 MED ORDER — BACITRACIN ZINC 500 UNIT/GM EX OINT
TOPICAL_OINTMENT | CUTANEOUS | Status: AC
  Filled 2020-10-16: qty 28.35

## 2020-10-16 MED ORDER — PROPOFOL 10 MG/ML IV BOLUS
INTRAVENOUS | Status: AC
  Filled 2020-10-16: qty 40

## 2020-10-16 MED ORDER — CHLORHEXIDINE GLUCONATE 0.12 % MT SOLN: 15.0000 mL | Freq: Once | OROMUCOSAL | Status: AC

## 2020-10-16 MED ORDER — LIDOCAINE HCL (CARDIAC) PF 100 MG/5ML IV SOSY
PREFILLED_SYRINGE | INTRAVENOUS | Status: DC | PRN
  Administered 2020-10-16: 100 mg via INTRAVENOUS

## 2020-10-16 MED ORDER — CEFAZOLIN SODIUM-DEXTROSE 2-4 GM/100ML-% IV SOLN
INTRAVENOUS | Status: AC
  Filled 2020-10-16: qty 100

## 2020-10-16 MED ORDER — SULFAMETHOXAZOLE-TRIMETHOPRIM 800-160 MG PO TABS: 1.0000 | ORAL_TABLET | Freq: Two times a day (BID) | ORAL | 0 refills | Status: AC

## 2020-10-16 MED ORDER — SUCCINYLCHOLINE CHLORIDE 20 MG/ML IJ SOLN
INTRAMUSCULAR | Status: DC | PRN
  Administered 2020-10-16: 120 mg via INTRAVENOUS

## 2020-10-16 MED ORDER — CEFAZOLIN SODIUM-DEXTROSE 2-4 GM/100ML-% IV SOLN
2.0000 g | INTRAVENOUS | Status: AC
  Administered 2020-10-16: 2 g via INTRAVENOUS

## 2020-10-16 MED ORDER — 0.9 % SODIUM CHLORIDE (POUR BTL) OPTIME
TOPICAL | Status: DC | PRN
  Administered 2020-10-16: 500 mL

## 2020-10-16 MED ORDER — HYDROCODONE-ACETAMINOPHEN 7.5-325 MG PO TABS
1.0000 | ORAL_TABLET | Freq: Once | ORAL | Status: AC | PRN
Start: 2020-10-16 — End: 2020-10-16

## 2020-10-16 MED ORDER — BUPIVACAINE HCL (PF) 0.5 % IJ SOLN
INTRAMUSCULAR | Status: AC
  Filled 2020-10-16: qty 30

## 2020-10-16 MED ORDER — FENTANYL CITRATE (PF) 100 MCG/2ML IJ SOLN
INTRAMUSCULAR | Status: DC | PRN
  Administered 2020-10-16: 50 ug via INTRAVENOUS
  Administered 2020-10-16: 100 ug via INTRAVENOUS

## 2020-10-16 MED ORDER — LACTATED RINGERS IV SOLN: INTRAVENOUS | Status: DC

## 2020-10-16 MED ORDER — BACITRACIN 500 UNIT/GM EX OINT
TOPICAL_OINTMENT | CUTANEOUS | Status: DC | PRN
  Administered 2020-10-16: 1 via TOPICAL

## 2020-10-16 MED ORDER — ONDANSETRON HCL 4 MG/2ML IJ SOLN
INTRAMUSCULAR | Status: DC | PRN
  Administered 2020-10-16: 4 mg via INTRAVENOUS

## 2020-10-16 MED ORDER — OXYCODONE-ACETAMINOPHEN 5-325 MG PO TABS: 1.0000 | ORAL_TABLET | Freq: Four times a day (QID) | ORAL | 0 refills | Status: DC | PRN

## 2020-10-16 MED ORDER — CHLORHEXIDINE GLUCONATE 0.12 % MT SOLN
OROMUCOSAL | Status: AC
  Administered 2020-10-16: 15 mL via OROMUCOSAL
  Filled 2020-10-16: qty 15

## 2020-10-16 MED ORDER — ACETAMINOPHEN 10 MG/ML IV SOLN
INTRAVENOUS | Status: AC
  Filled 2020-10-16: qty 100

## 2020-10-16 MED ORDER — PHENYLEPHRINE HCL (PRESSORS) 10 MG/ML IV SOLN
INTRAVENOUS | Status: DC | PRN
  Administered 2020-10-16: 200 ug via INTRAVENOUS
  Administered 2020-10-16 (×3): 100 ug via INTRAVENOUS

## 2020-10-16 MED ORDER — FENTANYL CITRATE (PF) 100 MCG/2ML IJ SOLN: 25.0000 ug | INTRAMUSCULAR | Status: DC | PRN

## 2020-10-16 SURGICAL SUPPLY — 37 items
APL PRP STRL LF DISP 70% ISPRP (MISCELLANEOUS) ×1
BLADE CLIPPER SURG (BLADE) ×1 IMPLANT
BLADE SURG 15 STRL LF DISP TIS (BLADE) ×1 IMPLANT
BLADE SURG 15 STRL SS (BLADE) ×2
CANISTER SUCT 1200ML W/VALVE (MISCELLANEOUS) ×1 IMPLANT
CHLORAPREP W/TINT 26 (MISCELLANEOUS) ×2 IMPLANT
DRAIN PENROSE 12X.25 LTX STRL (MISCELLANEOUS) ×1 IMPLANT
DRAPE LAPAROTOMY 77X122 PED (DRAPES) ×2 IMPLANT
DRSG GAUZE FLUFF 36X18 (GAUZE/BANDAGES/DRESSINGS) ×2 IMPLANT
DRSG TELFA 4X3 1S NADH ST (GAUZE/BANDAGES/DRESSINGS) ×1 IMPLANT
ELECT CAUTERY BLADE 6.4 (BLADE) IMPLANT
ELECT REM PT RETURN 9FT ADLT (ELECTROSURGICAL) ×2
ELECTRODE REM PT RTRN 9FT ADLT (ELECTROSURGICAL) ×1 IMPLANT
GAUZE 4X4 16PLY ~~LOC~~+RFID DBL (SPONGE) ×2 IMPLANT
GAUZE SPONGE 4X4 12PLY STRL (GAUZE/BANDAGES/DRESSINGS) IMPLANT
GLOVE SURG UNDER POLY LF SZ7.5 (GLOVE) ×2 IMPLANT
GOWN STRL REUS W/ TWL LRG LVL3 (GOWN DISPOSABLE) ×1 IMPLANT
GOWN STRL REUS W/TWL LRG LVL3 (GOWN DISPOSABLE) ×2
GOWN STRL REUS W/TWL XL LVL4 (GOWN DISPOSABLE) ×2 IMPLANT
KIT TURNOVER KIT A (KITS) ×2 IMPLANT
LABEL OR SOLS (LABEL) ×2 IMPLANT
MANIFOLD NEPTUNE II (INSTRUMENTS) ×2 IMPLANT
NEEDLE HYPO 25X1 1.5 SAFETY (NEEDLE) ×2 IMPLANT
NS IRRIG 500ML POUR BTL (IV SOLUTION) ×3 IMPLANT
PACK BASIN MINOR ARMC (MISCELLANEOUS) ×2 IMPLANT
SUPPORETR ATHLETIC LG (MISCELLANEOUS) ×1 IMPLANT
SUPPORTER ATHLETIC LG (MISCELLANEOUS) ×2
SUT CHROMIC 3 0 PS 2 (SUTURE) IMPLANT
SUT CHROMIC 3 0 SH 27 (SUTURE) ×3 IMPLANT
SUT ETHILON 3-0 FS-10 30 BLK (SUTURE)
SUT ETHILON 4 0 PS 2 18 (SUTURE) ×2 IMPLANT
SUT ETHILON NAB PS2 4-0 18IN (SUTURE) IMPLANT
SUT MNCRL 3-0 UNDYED SH (SUTURE) ×1 IMPLANT
SUT MONOCRYL 3-0 UNDYED (SUTURE) ×2
SUTURE EHLN 3-0 FS-10 30 BLK (SUTURE) IMPLANT
SYR 10ML LL (SYRINGE) ×2 IMPLANT
SYR BULB IRRIG 60ML STRL (SYRINGE) ×2 IMPLANT

## 2020-10-16 NOTE — Anesthesia Procedure Notes (Signed)
Procedure Name: Intubation Date/Time: 10/16/2020 10:40 AM Performed by: Lowry Bowl, CRNA Pre-anesthesia Checklist: Patient identified, Emergency Drugs available, Suction available and Patient being monitored Patient Re-evaluated:Patient Re-evaluated prior to induction Oxygen Delivery Method: Circle system utilized Preoxygenation: Pre-oxygenation with 100% oxygen Induction Type: IV induction Ventilation: Mask ventilation without difficulty Laryngoscope Size: McGraph and 4 Grade View: Grade I Tube type: Oral Tube size: 7.5 mm Number of attempts: 1 Airway Equipment and Method: Stylet and Video-laryngoscopy Placement Confirmation: ETT inserted through vocal cords under direct vision, positive ETCO2 and breath sounds checked- equal and bilateral Secured at: 23 cm Tube secured with: Tape Dental Injury: Teeth and Oropharynx as per pre-operative assessment  Difficulty Due To: Difficulty was anticipated and Difficult Airway- due to large tongue

## 2020-10-16 NOTE — H&P (Signed)
Urology H&P    History of Present Illness: Lance [name] is a 65 y.o. male with an enlarging, symptomatic left hydrocele presents requesting hydrocelectomy.  Refer to office note 09/14/2020 for details  Past Medical History:  Diagnosis Date   ALLERGIC RHINITIS    Allergy    Constipation    uses Senna daily    GERD (gastroesophageal reflux disease)    HYPERLIPIDEMIA    Hypertension    Impaired glucose tolerance    OSTEOARTHRITIS, HANDS, BILATERAL    Pre-diabetes     Past Surgical History:  Procedure Laterality Date   COLONOSCOPY     CYST EXCISION     neck and leg,drained and packed   INGUINAL HERNIA REPAIR  1995   right   POLYPECTOMY      Home Medications:  Current Meds  Medication Sig   aspirin EC 81 MG tablet Take 81 mg by mouth in the morning. Swallow whole.   atorvastatin (LIPITOR) 40 MG tablet Take 1 tablet by mouth once daily (Patient taking differently: Take 40 mg by mouth in the morning.)   cetirizine (ZYRTEC) 10 MG tablet Take 1 tablet by mouth once daily (Patient taking differently: Take 10 mg by mouth in the morning.)   ketoconazole (NIZORAL) 2 % cream Apply 1 application topically daily. (Patient taking differently: Apply 1 application topically daily as needed (skin irritation.).)   losartan (COZAAR) 100 MG tablet Take 1 tablet (100 mg total) by mouth daily. (Patient taking differently: Take 100 mg by mouth in the morning.)   metFORMIN (GLUCOPHAGE-XR) 500 MG 24 hr tablet Take 1 tablet (500 mg total) by mouth daily. (Patient taking differently: Take 500 mg by mouth in the morning.)   omeprazole (PRILOSEC) 40 MG capsule Take 1 capsule by mouth once daily   sennosides-docusate sodium (SENOKOT-S) 8.6-50 MG tablet Take 1 tablet by mouth in the morning.   triamcinolone (NASACORT) 55 MCG/ACT AERO nasal inhaler Place 2 sprays into the nose daily. (Patient taking differently: Place 2 sprays into the nose in the morning.)   valACYclovir (VALTREX) 1000 MG tablet Take 1  tablet by mouth once daily (Patient taking differently: Take 1,000 mg by mouth in the morning.)   zolpidem (AMBIEN) 10 MG tablet Take 1 tablet (10 mg total) by mouth at bedtime as needed for sleep.   [DISCONTINUED] omeprazole (PRILOSEC) 40 MG capsule Take 1 capsule by mouth once daily (Patient taking differently: Take 40 mg by mouth in the morning.)    Allergies: No Known Allergies  Family History  Problem Relation Age of Onset   Hypertension Other    Diabetes Other    Hypothyroidism Other    Aneurysm Other    Prostate cancer Father    Cancer Sister        unsure of type    Lung cancer Brother    Lung cancer Sister    Kidney disease Mother    Colon cancer Neg Hx    Stomach cancer Neg Hx    Colon polyps Neg Hx    Esophageal cancer Neg Hx    Rectal cancer Neg Hx     Social History:  reports that he has never smoked. He has never used smokeless tobacco. He reports that he does not drink alcohol and does not use drugs.  ROS: Otherwise noncontributory septa as per the HPI  Physical Exam:  Vital signs in last 24 hours: Temp:  [97.1 F (36.2 C)] 97.1 F (36.2 C) (07/26 0833) Pulse Rate:  [76] 76 (  07/26 0833) Resp:  [16] 16 (07/26 0833) BP: (157)/(99) 157/99 (07/26 0833) SpO2:  [100 %] 100 % (07/26 0833) Weight:  [100.7 kg] 100.7 kg (07/26 0833) Constitutional:  Alert and oriented, No acute distress HEENT: [place] AT, moist mucus membranes.  Trachea midline, no masses Cardiovascular: Regular rate and rhythm Respiratory: Normal respiratory effort, lungs clear bilaterally Neurologic: Grossly intact, no focal deficits, moving all 4 extremities Psychiatric: Normal mood and affect   Impression/Assessment:  Symptomatic left hydrocele  Plan:  Left hydrocelectomy The procedure has been discussed in detail as per the prior office note 09/14/2020.  We again reviewed the procedure and all questions were answered.   10/16/2020, 10:18 AM  John Giovanni,  MD

## 2020-10-16 NOTE — Anesthesia Preprocedure Evaluation (Signed)
Anesthesia Evaluation  Patient identified by MRN, date of birth, ID band Patient awake    Reviewed: Allergy & Precautions, NPO status , Patient's Chart, lab work & pertinent test results  Airway Mallampati: III  TM Distance: >3 FB Neck ROM: full    Dental no notable dental hx. (+) Teeth Intact   Pulmonary asthma , sleep apnea ,    Pulmonary exam normal        Cardiovascular hypertension, negative cardio ROS Normal cardiovascular exam     Neuro/Psych negative neurological ROS  negative psych ROS   GI/Hepatic Neg liver ROS, GERD  Medicated,  Endo/Other  diabetes, Type 2  Renal/GU      Musculoskeletal   Abdominal   Peds  Hematology negative hematology ROS (+)   Anesthesia Other Findings Past Medical History: No date: ALLERGIC RHINITIS No date: Allergy No date: Constipation     Comment:  uses Senna daily  No date: GERD (gastroesophageal reflux disease) No date: HYPERLIPIDEMIA No date: Hypertension No date: Impaired glucose tolerance No date: OSTEOARTHRITIS, HANDS, BILATERAL No date: Pre-diabetes  Past Surgical History: No date: COLONOSCOPY No date: CYST EXCISION     Comment:  neck and leg,drained and packed 1995: INGUINAL HERNIA REPAIR     Comment:  right No date: POLYPECTOMY  BMI    Body Mass Index: 31.40 kg/m      Reproductive/Obstetrics negative OB ROS                             Anesthesia Physical Anesthesia Plan  ASA: 2  Anesthesia Plan: General ETT and General   Post-op Pain Management:    Induction: Intravenous  PONV Risk Score and Plan: Ondansetron, Dexamethasone, Midazolam and Treatment may vary due to age or medical condition  Airway Management Planned: Oral ETT  Additional Equipment:   Intra-op Plan:   Post-operative Plan: Extubation in OR  Informed Consent: I have reviewed the patients History and Physical, chart, labs and discussed the procedure  including the risks, benefits and alternatives for the proposed anesthesia with the patient or authorized representative who has indicated his/her understanding and acceptance.     Dental Advisory Given  Plan Discussed with: Anesthesiologist, CRNA and Surgeon  Anesthesia Plan Comments: (Patient consented for risks of anesthesia including but not limited to:  - adverse reactions to medications - damage to eyes, teeth, lips or other oral mucosa - nerve damage due to positioning  - sore throat or hoarseness - Damage to heart, brain, nerves, lungs, other parts of body or loss of life  Patient voiced understanding.)        Anesthesia Quick Evaluation

## 2020-10-16 NOTE — Discharge Instructions (Addendum)
Discharge instructions following scrotal surgery  Call your doctor for: Fever is greater than 100.5 Severe nausea or vomiting Increasing pain not controlled by pain medication Increasing redness or drainage from incisions  The number for questions or concerns is [phone]  Activity level: No lifting greater than 10 pounds (about equal to gallon of milk) for the next 2 weeks or until cleared to do so at follow-up appointment.  Otherwise activity as tolerated by comfort level.  Diet: May resume your regular diet as tolerated  Driving: No driving while still taking opiate pain medications (weight at least 6-8 hours after last dose).  No driving if you still sore from surgery as it may limit her ability to react quickly if necessary.   Shower/bath: May shower 48 hours after surgery but not before your drain is removed.  No tub bath, hot tub or swimming for 10 days.  Wound care: He may cover wounds with sterile gauze as needed to prevent incisions rubbing on close follow-up in any seepage.  Where tight fitting underpants for at least 2 weeks.  He should apply cold compresses (ice or sac of frozen peas/corn) to your scrotum for at least 48 hours to reduce the swelling.  You should expect that his scrotum will swell up initially and then get smaller over the next 2-4 weeks.  You have a drain in place and can replace the gauze as needed.  You will have an appointment to have this removed on Thursday, 10/18/2020  Medication: Resume your regular medications Prescriptions for antibiotic and pain medication was sent to your pharmacy  Follow-up appointments: You will be contacted for an appointment to have your drain removed 10/18/2020 Follow-up appointment will be scheduled with Dr. Bernardo Heater in 4-6 weeks for a wound check.  AMBULATORY SURGERY  DISCHARGE INSTRUCTIONS   The drugs that you were given will stay in your system until tomorrow so for the next 24 hours you should not:  Drive an  automobile Make any legal decisions Drink any alcoholic beverage   You may resume regular meals tomorrow.  Today it is better to start with liquids and gradually work up to solid foods.  You may eat anything you prefer, but it is better to start with liquids, then soup and crackers, and gradually work up to solid foods.   Please notify your doctor immediately if you have any unusual bleeding, trouble breathing, redness and pain at the surgery site, drainage, fever, or pain not relieved by medication.    Additional Instructions:    Please contact your physician with any problems or Same Day Surgery at [phone], Monday through Friday 6 am to 4 pm, or [place] at [facility] number at [phone].

## 2020-10-16 NOTE — Interval H&P Note (Signed)
History and Physical Interval Note:  10/16/2020 10:21 AM  Lance [name]  has presented today for surgery, with the diagnosis of Left Hydrocele.  The various methods of treatment have been discussed with the patient and family. After consideration of risks, benefits and other options for treatment, the patient has consented to  Procedure(s): HYDROCELECTOMY ADULT (Left) as a surgical intervention.  The patient's history has been reviewed, patient examined, no change in status, stable for surgery.  I have reviewed the patient's chart and labs.  Questions were answered to the patient's satisfaction.     [place]

## 2020-10-16 NOTE — Anesthesia Postprocedure Evaluation (Signed)
Anesthesia Post Note  Patient: Lance [name]  Procedure(s) Performed: HYDROCELECTOMY ADULT (Left: Groin)  Patient location during evaluation: PACU Anesthesia Type: General Level of consciousness: awake and alert Pain management: pain level controlled Vital Signs Assessment: post-procedure vital signs reviewed and stable Respiratory status: spontaneous breathing, nonlabored ventilation, respiratory function stable and patient connected to nasal cannula oxygen Cardiovascular status: blood pressure returned to baseline and stable Postop Assessment: no apparent nausea or vomiting Anesthetic complications: no   No notable events documented.   Last Vitals:  Vitals:   10/16/20 1215 10/16/20 1230  BP: (!) 169/91 (!) 153/84  Pulse: 86 77  Resp: (!) 9 16  Temp:  (!) 36.3 C  SpO2: 100% 95%    Last Pain:  Vitals:   10/16/20 1230  TempSrc:   PainSc: 0-No pain                 Margaree Mackintosh

## 2020-10-16 NOTE — Transfer of Care (Signed)
Immediate Anesthesia Transfer of Care Note  Patient: [name]  Procedure(s) Performed: HYDROCELECTOMY ADULT (Left: Groin)  Patient Location: PACU  Anesthesia Type:General  Level of Consciousness: awake, drowsy and patient cooperative  Airway & Oxygen Therapy: Patient Spontanous Breathing and Patient connected to face mask oxygen  Post-op Assessment: Report given to RN and Post -op Vital signs reviewed and stable  Post vital signs: Reviewed and stable  Last Vitals:  Vitals Value Taken Time  BP 156/85 10/16/20 1209  Temp    Pulse 85 10/16/20 1211  Resp 18 10/16/20 1211  SpO2 100 % 10/16/20 1211  Vitals shown include unvalidated device data.  Last Pain:  Vitals:   10/16/20 0833  TempSrc: Temporal  PainSc: 0-No pain         Complications: No notable events documented.

## 2020-10-16 NOTE — Op Note (Signed)
Preoperative diagnosis:  Left hydrocele  Postoperative diagnosis:  Left hydrocele  Procedure: Left hydrocelectomy  Surgeon: Abbie Sons, MD  Anesthesia: General  Complications: None  Intraoperative findings: Moderate-large left hydrocele.  Testis normal in appearance.  230 cc straw-colored fluid obtained  EBL: Minimal  Specimens: Hydrocele sac  Indication: Lance [name] is a 65 y.o. male with a gradually enlarging left hydrocele which has become symptomatic.  After reviewing the management options for treatment, he elected to proceed with the above surgical procedure(s). We have discussed the potential benefits and risks of the procedure, side effects of the proposed treatment, the likelihood of the patient achieving the goals of the procedure, and any potential problems that might occur during the procedure or recuperation. Informed consent has been obtained.  Description of procedure:  The patient was taken to the operating room and general anesthesia was induced.  The patient was placed in the supine position, prepped and draped in the usual sterile fashion, and preoperative antibiotics were administered. A preoperative time-out was performed.   A transverse incision was made in the midportion of the left hemiscrotum.  Dartos fascia was incised with cautery to expose the hydrocele sac.  The hydrocele sac was then bluntly separated from scrotal walls with a combination of blunt dissection and cautery and delivered into the operative field.  The sac was then incised anteriorly and 230 mL of straw-colored fluid was suctioned.  The hydrocele sac was then opened anteriorly with cautery.  The testis and cord structures were normal in appearance.  There was no evidence of a communicating hydrocele.  The hydrocele sac was excised with cautery leaving a rim of approximately 2 cm around the testis and cord structures.  The sac edges were then whipstitched with a running 3-0 chromic  suture.    A 1/4 inch Penrose drain was placed through the dependent portion of the left hemiscrotum via a separate stab incision and secured with 4-0 nylon.  The testis was delivered back into the scrotum in its anatomic position.  The incision was anesthetized with 0.25% plain Sensorcaine.  The dartos was closed with a running suture of 3-0 Monocryl and the skin was closed with a running horizontal mattress suture of 3-0 Monocryl.  A dressing of fluffs and scrotal support was applied.  The patient was transported to the PACU in stable condition after anesthetic reversal.  Plan: With follow-up appointment 10/18/2020 for Penrose drain removal   Abbie Sons, M.D.

## 2020-10-17 LAB — SURGICAL PATHOLOGY

## 2020-10-18 ENCOUNTER — Ambulatory Visit (INDEPENDENT_AMBULATORY_CARE_PROVIDER_SITE_OTHER): Payer: Medicare (Managed Care) | Admitting: Physician Assistant

## 2020-10-18 ENCOUNTER — Other Ambulatory Visit: Payer: Self-pay

## 2020-10-18 VITALS — BP 159/103 | HR 72 | Ht 70.0 in | Wt 222.0 lb

## 2020-10-18 DIAGNOSIS — N433 Hydrocele, unspecified: Secondary | ICD-10-CM

## 2020-10-18 NOTE — Progress Notes (Signed)
Patient presented to clinic today for postop drain removal.  He is POD 2 from left hydrocelectomy with Dr. Bernardo Heater.  No intraoperative complications.  Surgical pathology has finalized with fibromembranous tissue compatible with hydrocele sac and negative for malignancy.  On physical exam, Penrose drain has already fallen out and remains attached via its securing stitch.  Using sterile suture scissors, I snipped the securing stitch and remove the drain and stitch in their entirety.  Patient tolerated well.  Drain site appears to be at least superficially open with scant drainage.  I covered this area with 4 x 4 gauze.  Counseled the patient to continue scrotal support with supportive underwear, cryotherapy, and scrotal elevation as needed.  Counseled him that he may transition from Percocet to ibuprofen/Tylenol if his pain is mild to moderate.  Shared his negative pathology results with the patient and he expressed understanding.  We will plan for 6-week follow-up with Dr. Bernardo Heater.  Debroah Loop, PA-C 10/18/20 9:24 AM

## 2020-10-18 NOTE — Patient Instructions (Signed)
Swelling in the scrotum may take some time to resolve, as the scrotum is located below your torso and fluid has to work against gravity to leave this tissue. To promote resolution of swelling and decrease discomfort, please follow the scrotal support instructions below.  Scrotal support instructions:  Wear compressive underwear, e.g. jockstrap or snug boxer briefs, to keep the scrotum supported and promote drainage of swelling. Elevate the scrotum when at rest. Tuck a rolled washcloth or towel underneath the scrotum to lift it up and promote drainage back into your abdomen. Use ice as needed for pain relief. Never apply ice to the scrotum for longer than 20 minutes at a time and always keep a layer of fabric between the ice and the skin.  Take ibuprofen or acetaminophen for pain relief.

## 2020-11-03 ENCOUNTER — Other Ambulatory Visit: Payer: Self-pay | Admitting: Internal Medicine

## 2020-11-03 NOTE — Telephone Encounter (Signed)
Please refill as per office routine med refill policy (all routine meds refilled for 3 mo or monthly per pt preference up to one year from last visit, then month to month grace period for 3 mo, then further med refills will have to be denied)  

## 2020-11-21 ENCOUNTER — Ambulatory Visit: Payer: Self-pay | Admitting: Urology

## 2020-11-23 ENCOUNTER — Encounter: Payer: Self-pay | Admitting: Urology

## 2020-11-23 ENCOUNTER — Other Ambulatory Visit: Payer: Self-pay

## 2020-11-23 ENCOUNTER — Ambulatory Visit (INDEPENDENT_AMBULATORY_CARE_PROVIDER_SITE_OTHER): Payer: Medicare (Managed Care) | Admitting: Urology

## 2020-11-23 VITALS — BP 162/90 | HR 81 | Ht 70.0 in | Wt 220.0 lb

## 2020-11-23 DIAGNOSIS — Z09 Encounter for follow-up examination after completed treatment for conditions other than malignant neoplasm: Secondary | ICD-10-CM

## 2020-11-23 NOTE — Progress Notes (Signed)
11/23/2020 11:48 AM   Lance [name] [birth year] [id number]  Referring provider: Biagio Borg, MD [address] [place],  [place] [address]  Chief Complaint  Patient presents with   Follow-up    HPI: 65 y.o. male presents for postop follow-up.  Status post left hydrocelectomy 10/16/2020 230 cc volume Drain removed 7/28 No postoperative problems and states he is doing well   PMH: Past Medical History:  Diagnosis Date   ALLERGIC RHINITIS    Allergy    Constipation    uses Senna daily    GERD (gastroesophageal reflux disease)    HYPERLIPIDEMIA    Hypertension    Impaired glucose tolerance    OSTEOARTHRITIS, HANDS, BILATERAL    Pre-diabetes     Surgical History: Past Surgical History:  Procedure Laterality Date   COLONOSCOPY     CYST EXCISION     neck and leg,drained and packed   HYDROCELE EXCISION Left 10/16/2020   Procedure: HYDROCELECTOMY ADULT;  Surgeon: Abbie Sons, MD;  Location: [facility] ORS;  Service: Urology;  Laterality: Left;   INGUINAL HERNIA REPAIR  1995   right   POLYPECTOMY      Home Medications:  Allergies as of 11/23/2020   No Known Allergies      Medication List        Accurate as of November 23, 2020 11:48 AM. If you have any questions, ask your nurse or doctor.          STOP taking these medications    oxyCODONE-acetaminophen 5-325 MG tablet Commonly known as: PERCOCET/ROXICET Stopped by: Abbie Sons, MD       TAKE these medications    aspirin EC 81 MG tablet Take 81 mg by mouth in the morning. Swallow whole.   atorvastatin 40 MG tablet Commonly known as: LIPITOR Take 1 tablet by mouth once daily   cetirizine 10 MG tablet Commonly known as: ZYRTEC Take 1 tablet by mouth once daily What changed: when to take this   ketoconazole 2 % cream Commonly known as: NIZORAL Apply 1 application topically daily. What changed:  when to take this reasons to take this   losartan 100 MG tablet Commonly known as:  COZAAR Take 1 tablet (100 mg total) by mouth daily.   metFORMIN 500 MG 24 hr tablet Commonly known as: GLUCOPHAGE-XR Take 1 tablet (500 mg total) by mouth daily. What changed: when to take this   omeprazole 40 MG capsule Commonly known as: PRILOSEC Take 1 capsule by mouth once daily   sennosides-docusate sodium 8.6-50 MG tablet Commonly known as: SENOKOT-S Take 1 tablet by mouth in the morning.   triamcinolone 55 MCG/ACT Aero nasal inhaler Commonly known as: NASACORT Place 2 sprays into the nose daily. What changed: when to take this   valACYclovir 1000 MG tablet Commonly known as: VALTREX Take 1 tablet by mouth once daily What changed: when to take this   zolpidem 10 MG tablet Commonly known as: AMBIEN Take 1 tablet (10 mg total) by mouth at bedtime as needed for sleep.        Allergies: No Known Allergies  Family History: Family History  Problem Relation Age of Onset   Hypertension Other    Diabetes Other    Hypothyroidism Other    Aneurysm Other    Prostate cancer Father    Cancer Sister        unsure of type    Lung cancer Brother    Lung cancer Sister  Kidney disease Mother    Colon cancer Neg Hx    Stomach cancer Neg Hx    Colon polyps Neg Hx    Esophageal cancer Neg Hx    Rectal cancer Neg Hx     Social History:  reports that he has never smoked. He has never used smokeless tobacco. He reports that he does not drink alcohol and does not use drugs.   Physical Exam: BP (!) 162/90   Pulse 81   Ht '5\' 10"'$  (1.778 m)   Wt 220 lb (99.8 kg)   BMI 31.57 kg/m   Constitutional:  Alert and oriented, No acute distress. HEENT: [address] AT, moist mucus membranes.  Trachea midline, no masses. Cardiovascular: No clubbing, cyanosis, or edema. Respiratory: Normal respiratory effort, no increased work of breathing. GU: Scrotal incision healing well.  No significant left hemiscrotal swelling.  Mild increase supratesticular firmness consistent with postoperative  change   Assessment & Plan:    Doing well status post hydrocelectomy Follow-up prn  Abbie Sons, MD  [facility] [address] [place], [place] [address] [phone]

## 2020-11-29 ENCOUNTER — Ambulatory Visit: Payer: Self-pay | Admitting: Urology

## 2021-01-04 ENCOUNTER — Other Ambulatory Visit: Payer: Self-pay | Admitting: Internal Medicine

## 2021-01-08 ENCOUNTER — Other Ambulatory Visit: Payer: Self-pay | Admitting: Internal Medicine

## 2021-01-08 NOTE — Telephone Encounter (Signed)
Please refill as per office routine med refill policy (all routine meds to be refilled for 3 mo or monthly (per pt preference) up to one year from last visit, then month to month grace period for 3 mo, then further med refills will have to be denied) ? ?

## 2021-01-21 ENCOUNTER — Encounter: Payer: Medicare (Managed Care) | Admitting: Internal Medicine

## 2021-01-28 ENCOUNTER — Other Ambulatory Visit (INDEPENDENT_AMBULATORY_CARE_PROVIDER_SITE_OTHER): Payer: Medicare (Managed Care)

## 2021-01-28 ENCOUNTER — Ambulatory Visit (INDEPENDENT_AMBULATORY_CARE_PROVIDER_SITE_OTHER): Payer: Medicare (Managed Care) | Admitting: Internal Medicine

## 2021-01-28 ENCOUNTER — Encounter: Payer: Self-pay | Admitting: Internal Medicine

## 2021-01-28 ENCOUNTER — Other Ambulatory Visit: Payer: Self-pay

## 2021-01-28 VITALS — BP 160/100 | HR 81 | Temp 98.8°F | Ht 70.0 in | Wt 221.0 lb

## 2021-01-28 DIAGNOSIS — M25532 Pain in left wrist: Secondary | ICD-10-CM

## 2021-01-28 DIAGNOSIS — E559 Vitamin D deficiency, unspecified: Secondary | ICD-10-CM | POA: Diagnosis not present

## 2021-01-28 DIAGNOSIS — I10 Essential (primary) hypertension: Secondary | ICD-10-CM | POA: Diagnosis not present

## 2021-01-28 DIAGNOSIS — E1165 Type 2 diabetes mellitus with hyperglycemia: Secondary | ICD-10-CM

## 2021-01-28 DIAGNOSIS — Z0001 Encounter for general adult medical examination with abnormal findings: Secondary | ICD-10-CM

## 2021-01-28 DIAGNOSIS — E538 Deficiency of other specified B group vitamins: Secondary | ICD-10-CM

## 2021-01-28 DIAGNOSIS — Z0000 Encounter for general adult medical examination without abnormal findings: Secondary | ICD-10-CM

## 2021-01-28 DIAGNOSIS — E782 Mixed hyperlipidemia: Secondary | ICD-10-CM

## 2021-01-28 LAB — URINALYSIS, ROUTINE W REFLEX MICROSCOPIC
Bilirubin Urine: NEGATIVE
Hgb urine dipstick: NEGATIVE
Ketones, ur: NEGATIVE
Leukocytes,Ua: NEGATIVE
Nitrite: NEGATIVE
RBC / HPF: NONE SEEN (ref 0–?)
Specific Gravity, Urine: 1.015 (ref 1.000–1.030)
Total Protein, Urine: NEGATIVE
Urine Glucose: NEGATIVE
Urobilinogen, UA: 0.2 (ref 0.0–1.0)
WBC, UA: NONE SEEN (ref 0–?)
pH: 6 (ref 5.0–8.0)

## 2021-01-28 LAB — HEPATIC FUNCTION PANEL
ALT: 31 U/L (ref 0–53)
AST: 24 U/L (ref 0–37)
Albumin: 4.6 g/dL (ref 3.5–5.2)
Alkaline Phosphatase: 44 U/L (ref 39–117)
Bilirubin, Direct: 0.2 mg/dL (ref 0.0–0.3)
Total Bilirubin: 1.3 mg/dL — ABNORMAL HIGH (ref 0.2–1.2)
Total Protein: 6.8 g/dL (ref 6.0–8.3)

## 2021-01-28 LAB — MICROALBUMIN / CREATININE URINE RATIO
Creatinine,U: 143.4 mg/dL
Microalb Creat Ratio: 0.6 mg/g (ref 0.0–30.0)
Microalb, Ur: 0.8 mg/dL (ref 0.0–1.9)

## 2021-01-28 LAB — CBC WITH DIFFERENTIAL/PLATELET
Basophils Absolute: 0 10*3/uL (ref 0.0–0.1)
Basophils Relative: 0.2 % (ref 0.0–3.0)
Eosinophils Absolute: 0.2 10*3/uL (ref 0.0–0.7)
Eosinophils Relative: 3.5 % (ref 0.0–5.0)
HCT: 39.7 % (ref 39.0–52.0)
Hemoglobin: 13.1 g/dL (ref 13.0–17.0)
Lymphocytes Relative: 38.8 % (ref 12.0–46.0)
Lymphs Abs: 2.4 10*3/uL (ref 0.7–4.0)
MCHC: 32.9 g/dL (ref 30.0–36.0)
MCV: 89.7 fl (ref 78.0–100.0)
Monocytes Absolute: 0.4 10*3/uL (ref 0.1–1.0)
Monocytes Relative: 6.4 % (ref 3.0–12.0)
Neutro Abs: 3.2 10*3/uL (ref 1.4–7.7)
Neutrophils Relative %: 51.1 % (ref 43.0–77.0)
Platelets: 181 10*3/uL (ref 150.0–400.0)
RBC: 4.43 Mil/uL (ref 4.22–5.81)
RDW: 13.4 % (ref 11.5–15.5)
WBC: 6.2 10*3/uL (ref 4.0–10.5)

## 2021-01-28 LAB — BASIC METABOLIC PANEL
BUN: 10 mg/dL (ref 6–23)
CO2: 32 mEq/L (ref 19–32)
Calcium: 9.2 mg/dL (ref 8.4–10.5)
Chloride: 103 mEq/L (ref 96–112)
Creatinine, Ser: 1.37 mg/dL (ref 0.40–1.50)
GFR: 53.36 mL/min — ABNORMAL LOW (ref >60.00)
Glucose, Bld: 125 mg/dL — ABNORMAL HIGH (ref 70–99)
Potassium: 4.1 mEq/L (ref 3.5–5.1)
Sodium: 141 mEq/L (ref 135–145)

## 2021-01-28 LAB — LIPID PANEL
Cholesterol: 141 mg/dL (ref 0–200)
HDL: 47.3 mg/dL (ref >39.00)
LDL Cholesterol: 68 mg/dL (ref 0–99)
NonHDL: 94.02
Total CHOL/HDL Ratio: 3
Triglycerides: 130 mg/dL (ref 0.0–149.0)
VLDL: 26 mg/dL (ref 0.0–40.0)

## 2021-01-28 LAB — VITAMIN D 25 HYDROXY (VIT D DEFICIENCY, FRACTURES): VITD: 33.93 ng/mL (ref 30.00–100.00)

## 2021-01-28 LAB — HEMOGLOBIN A1C: Hgb A1c MFr Bld: 6.8 % — ABNORMAL HIGH (ref 4.6–6.5)

## 2021-01-28 LAB — PSA: PSA: 1.39 ng/mL (ref 0.10–4.00)

## 2021-01-28 LAB — TSH: TSH: 2.11 u[IU]/mL (ref 0.35–5.50)

## 2021-01-28 NOTE — Assessment & Plan Note (Signed)
BP Readings from Last 3 Encounters:  01/28/21 (!) 160/100  11/23/20 (!) 162/90  10/18/20 (!) 159/103   Uncontrolled here,  Pt states BP controlled at home, pt to continue medical treatment losartan

## 2021-01-28 NOTE — Assessment & Plan Note (Signed)
Lab Results  Component Value Date   LDLCALC 68 01/28/2021   Stable, pt to continue current statin lipitor 40

## 2021-01-28 NOTE — Assessment & Plan Note (Signed)
Age and sex appropriate education and counseling updated with regular exercise and diet Referrals for preventative services - pt to call for eye exam  Immunizations addressed - declines flu shot Smoking counseling  - none needed Evidence for depression or other mood disorder - none significant Most recent labs reviewed. I have personally reviewed and have noted: 1) the patient's medical and social history 2) The patient's current medications and supplements 3) The patient's height, weight, and BMI have been recorded in the chart

## 2021-01-28 NOTE — Patient Instructions (Signed)
Please remember to call for your yearly eye appointment  Please continue all other medications as before, and refills have been done if requested.  Please have the pharmacy call with any other refills you may need.  Please continue your efforts at being more active, low cholesterol diet, and weight control.  You are otherwise up to date with prevention measures today.  Please keep your appointments with your specialists as you may have planned  Your lab testing should return results in just a few more hours  You will be contacted regarding the referral for: Hand Surgury for the left hand and wrist pain/swelling  Please make an Appointment to return in 6 months, or sooner if needed, also with Lab Appointment for testing done 3-5 days before at the [facility] (so this is for TWO appointments - please see the scheduling desk as you leave)  Due to the ongoing Covid 19 pandemic, our lab now requires an appointment for any labs done at our office.  If you need labs done and do not have an appointment, please call our office ahead of time to schedule before presenting to the lab for your testing.

## 2021-01-28 NOTE — Progress Notes (Signed)
Patient ID: Lance [name], male   DOB: Aug 02, 1955, 65 y.o.   MRN: [id number]         Chief Complaint:: wellness exam and DM, HTN, left wrist pain       HPI:  Lance [name] is a 65 y.o. male here for wellness exam; pt states will call for eye exam, declines flu shot; o/w up to date                        Also Pt denies chest pain, increased sob or doe, wheezing, orthopnea, PND, increased LE swelling, palpitations, dizziness or syncope.   Pt denies polydipsia, polyuria, or new focal neuro s/s.   Pt denies fever, wt loss, night sweats, loss of appetite, or other constitutional symptoms  BP at home < 140/90 per pt.  Also had onset 2 months persistent left post mid wrist pain, denies traums but something happened now with pain and mild swelling of the wrist and whole hand persists, and cannot extend the wrist.     Wt Readings from Last 3 Encounters:  01/28/21 221 lb (100.2 kg)  11/23/20 220 lb (99.8 kg)  10/18/20 222 lb (100.7 kg)   BP Readings from Last 3 Encounters:  01/28/21 (!) 160/100  11/23/20 (!) 162/90  10/18/20 (!) 159/103   Immunization History  Administered Date(s) Administered   Fluad Quad(high Dose 65+) 01/20/2020   Influenza Whole 02/05/2006   Influenza,inj,Quad PF,6+ Mos 11/28/2014, 12/19/2016, 01/08/2018   Influenza-Unspecified 12/03/2015, 12/30/2018   PFIZER(Purple Top)SARS-COV-2 Vaccination 04/22/2019, 05/13/2019, 12/13/2019, 12/31/2020   Pneumococcal Conjugate-13 01/14/2019   Pneumococcal Polysaccharide-23 01/20/2020   Td 05/11/2009   Tdap 07/15/2019   Zoster Recombinat (Shingrix) 07/26/2018, 11/01/2018   There are no preventive care reminders to display for this patient.     Past Medical History:  Diagnosis Date   ALLERGIC RHINITIS    Allergy    Constipation    uses Senna daily    GERD (gastroesophageal reflux disease)    HYPERLIPIDEMIA    Hypertension    Impaired glucose tolerance    OSTEOARTHRITIS, HANDS, BILATERAL    Pre-diabetes    Past  Surgical History:  Procedure Laterality Date   COLONOSCOPY     CYST EXCISION     neck and leg,drained and packed   HYDROCELE EXCISION Left 10/16/2020   Procedure: HYDROCELECTOMY ADULT;  Surgeon: Abbie Sons, MD;  Location: [facility] ORS;  Service: Urology;  Laterality: Left;   [place]   right   POLYPECTOMY      reports that he has never smoked. He has never used smokeless tobacco. He reports that he does not drink alcohol and does not use drugs. family history includes Aneurysm in an other family member; Cancer in his sister; Diabetes in an other family member; Hypertension in an other family member; Hypothyroidism in an other family member; Kidney disease in his mother; Lung cancer in his brother and sister; Prostate cancer in his father. No Known Allergies Current Outpatient Medications on File Prior to Visit  Medication Sig Dispense Refill   aspirin EC 81 MG tablet Take 81 mg by mouth in the morning. Swallow whole.     atorvastatin (LIPITOR) 40 MG tablet Take 1 tablet by mouth once daily 64 tablet 0   cetirizine (ZYRTEC) 10 MG tablet Take 1 tablet by mouth once daily (Patient taking differently: Take 10 mg by mouth in the morning.) 90 tablet 3   ketoconazole (NIZORAL) 2 %  cream Apply 1 application topically daily. (Patient taking differently: Apply 1 application topically daily as needed (skin irritation.).) 30 g 1   losartan (COZAAR) 100 MG tablet Take 1 tablet (100 mg total) by mouth daily. 90 tablet 3   metFORMIN (GLUCOPHAGE-XR) 500 MG 24 hr tablet Take 1 tablet (500 mg total) by mouth daily. (Patient taking differently: Take 500 mg by mouth in the morning.) 90 tablet 3   omeprazole (PRILOSEC) 40 MG capsule Take 1 capsule by mouth once daily 90 capsule 0   sennosides-docusate sodium (SENOKOT-S) 8.6-50 MG tablet Take 1 tablet by mouth in the morning.     triamcinolone (NASACORT) 55 MCG/ACT AERO nasal inhaler Place 2 sprays into the nose daily. (Patient taking  differently: Place 2 sprays into the nose in the morning.) 1 Inhaler 12   valACYclovir (VALTREX) 1000 MG tablet Take 1 tablet by mouth once daily (Patient taking differently: Take 1,000 mg by mouth in the morning.) 90 tablet 3   zolpidem (AMBIEN) 10 MG tablet TAKE 1 TABLET BY MOUTH AT BEDTIME AS NEEDED FOR SLEEP 90 tablet 1   No current facility-administered medications on file prior to visit.        ROS:  All others reviewed and negative.  Objective        PE:  BP (!) 160/100 (BP Location: Left Arm, Patient Position: Sitting, Cuff Size: Large)   Pulse 81   Temp 98.8 F (37.1 C) (Oral)   Ht 5\' 10"  (1.778 m)   Wt 221 lb (100.2 kg)   SpO2 98%   BMI 31.71 kg/m                 Constitutional: Pt appears in NAD               HENT: Head: NCAT.                Right Ear: External ear normal.                 Left Ear: External ear normal.                Eyes: . Pupils are equal, round, and reactive to light. Conjunctivae and EOM are normal, has right exotropia chronic               Nose: without d/c or deformity               Neck: Neck supple. Gross normal ROM               Cardiovascular: Normal rate and regular rhythm.                 Pulmonary/Chest: Effort normal and breath sounds without rales or wheezing.                Abd:  Soft, NT, ND, + BS, no organomegaly               Left wrist with mid post soreness and mild whole hand diffuse swelling distal involving the wrist,hands, fingers o.w no skin changes and neurovasc intact; has minimal ability to extend left wrist with pain               Neurological: Pt is alert. At baseline orientation, motor grossly intact               Skin: Skin is warm. No rashes, no other new lesions, LE edema - none  Psychiatric: Pt behavior is normal without agitation   Micro: none  Cardiac tracings I have personally interpreted today:  none  Pertinent Radiological findings (summarize): none   Lab Results  Component Value Date   WBC  6.2 01/28/2021   HGB 13.1 01/28/2021   HCT 39.7 01/28/2021   PLT 181.0 01/28/2021   GLUCOSE 125 (H) 01/28/2021   CHOL 141 01/28/2021   TRIG 130.0 01/28/2021   HDL 47.30 01/28/2021   LDLDIRECT 54.0 07/09/2020   LDLCALC 68 01/28/2021   ALT 31 01/28/2021   AST 24 01/28/2021   NA 141 01/28/2021   K 4.1 01/28/2021   CL 103 01/28/2021   CREATININE 1.37 01/28/2021   BUN 10 01/28/2021   CO2 32 01/28/2021   TSH 2.11 01/28/2021   PSA 1.39 01/28/2021   HGBA1C 6.8 (H) 01/28/2021   MICROALBUR 0.8 01/28/2021   Assessment/Plan:  ERRIC MACHNIK is a 65 y.o. Black or African American [2] male with  has a past medical history of ALLERGIC RHINITIS, Allergy, Constipation, GERD (gastroesophageal reflux disease), HYPERLIPIDEMIA, Hypertension, Impaired glucose tolerance, OSTEOARTHRITIS, HANDS, BILATERAL, and Pre-diabetes.  Encounter for well adult exam with abnormal findings Age and sex appropriate education and counseling updated with regular exercise and diet Referrals for preventative services - pt to call for eye exam  Immunizations addressed - declines flu shot Smoking counseling  - none needed Evidence for depression or other mood disorder - none significant Most recent labs reviewed. I have personally reviewed and have noted: 1) the patient's medical and social history 2) The patient's current medications and supplements 3) The patient's height, weight, and BMI have been recorded in the chart   Diabetes [facility]) Lab Results  Component Value Date   HGBA1C 6.8 (H) 01/28/2021   Stable, pt to continue current medical treatment  - metformin   Hypertension, uncontrolled BP Readings from Last 3 Encounters:  01/28/21 (!) 160/100  11/23/20 (!) 162/90  10/18/20 (!) 159/103   Uncontrolled here,  Pt states BP controlled at home, pt to continue medical treatment losartan   Left wrist pain Etiology unclear but suspect significant wrist bone or ligament injury, now for hand surgury  referral  Hyperlipidemia Lab Results  Component Value Date   LDLCALC 68 01/28/2021   Stable, pt to continue current statin lipitor 40  Followup: No follow-ups on file.  Cathlean Cower, MD 01/28/2021 8:05 PM [facility]

## 2021-01-28 NOTE — Assessment & Plan Note (Signed)
Etiology unclear but suspect significant wrist bone or ligament injury, now for hand surgury referral

## 2021-01-28 NOTE — Assessment & Plan Note (Signed)
Lab Results  Component Value Date   HGBA1C 6.8 (H) 01/28/2021   Stable, pt to continue current medical treatment  - metformin

## 2021-03-04 ENCOUNTER — Other Ambulatory Visit: Payer: Self-pay | Admitting: Internal Medicine

## 2021-03-04 NOTE — Telephone Encounter (Signed)
Please refill as per office routine med refill policy (all routine meds to be refilled for 3 mo or monthly (per pt preference) up to one year from last visit, then month to month grace period for 3 mo, then further med refills will have to be denied) ? ?

## 2021-04-01 ENCOUNTER — Encounter: Payer: Self-pay | Admitting: Internal Medicine

## 2021-04-01 LAB — HM DIABETES EYE EXAM

## 2021-04-02 ENCOUNTER — Other Ambulatory Visit: Payer: Self-pay | Admitting: Internal Medicine

## 2021-04-02 NOTE — Telephone Encounter (Signed)
Please refill as per office routine med refill policy (all routine meds to be refilled for 3 mo or monthly (per pt preference) up to one year from last visit, then month to month grace period for 3 mo, then further med refills will have to be denied) ? ?

## 2021-04-04 ENCOUNTER — Other Ambulatory Visit: Payer: Self-pay | Admitting: Internal Medicine

## 2021-04-04 NOTE — Telephone Encounter (Signed)
Please refill as per office routine med refill policy (all routine meds to be refilled for 3 mo or monthly (per pt preference) up to one year from last visit, then month to month grace period for 3 mo, then further med refills will have to be denied) ? ?

## 2021-05-31 ENCOUNTER — Other Ambulatory Visit: Payer: Self-pay | Admitting: Family Medicine

## 2021-05-31 DIAGNOSIS — Z136 Encounter for screening for cardiovascular disorders: Secondary | ICD-10-CM

## 2021-05-31 DIAGNOSIS — Z87891 Personal history of nicotine dependence: Secondary | ICD-10-CM

## 2021-06-03 ENCOUNTER — Other Ambulatory Visit: Payer: Self-pay | Admitting: Internal Medicine

## 2021-06-10 ENCOUNTER — Ambulatory Visit: Payer: Medicare (Managed Care) | Admitting: Family Medicine

## 2021-06-13 ENCOUNTER — Other Ambulatory Visit: Payer: Self-pay

## 2021-06-13 ENCOUNTER — Ambulatory Visit
Admission: RE | Admit: 2021-06-13 | Discharge: 2021-06-13 | Disposition: A | Discharge status: Home / Self Care | Payer: Medicare (Managed Care) | Source: Ambulatory Visit | Attending: Family Medicine | Admitting: Family Medicine

## 2021-06-13 DIAGNOSIS — Z136 Encounter for screening for cardiovascular disorders: Secondary | ICD-10-CM

## 2021-06-13 DIAGNOSIS — Z87891 Personal history of nicotine dependence: Secondary | ICD-10-CM

## 2021-07-29 ENCOUNTER — Ambulatory Visit: Payer: Medicare (Managed Care) | Admitting: Internal Medicine

## 2021-08-02 ENCOUNTER — Ambulatory Visit: Payer: Medicare (Managed Care) | Admitting: Internal Medicine

## 2021-08-26 ENCOUNTER — Other Ambulatory Visit: Payer: Self-pay | Admitting: Internal Medicine

## 2021-09-15 ENCOUNTER — Other Ambulatory Visit: Payer: Self-pay | Admitting: Internal Medicine

## 2021-10-01 ENCOUNTER — Other Ambulatory Visit: Payer: Self-pay | Admitting: Internal Medicine

## 2021-10-01 NOTE — Telephone Encounter (Signed)
Please refill as per office routine med refill policy (all routine meds to be refilled for 3 mo or monthly (per pt preference) up to one year from last visit, then month to month grace period for 3 mo, then further med refills will have to be denied) ? ?

## 2021-11-12 ENCOUNTER — Other Ambulatory Visit: Payer: Self-pay | Admitting: Internal Medicine

## 2021-11-12 NOTE — Telephone Encounter (Signed)
Please refill as per office routine med refill policy (all routine meds to be refilled for 3 mo or monthly (per pt preference) up to one year from last visit, then month to month grace period for 3 mo, then further med refills will have to be denied) ? ?

## 2021-11-29 ENCOUNTER — Ambulatory Visit
Admission: RE | Admit: 2021-11-29 | Discharge: 2021-11-29 | Disposition: A | Discharge status: Home / Self Care | Payer: Medicare (Managed Care) | Source: Ambulatory Visit | Attending: Family Medicine | Admitting: Family Medicine

## 2021-11-29 ENCOUNTER — Other Ambulatory Visit: Payer: Self-pay | Admitting: Family Medicine

## 2021-11-29 DIAGNOSIS — J209 Acute bronchitis, unspecified: Secondary | ICD-10-CM

## 2021-11-30 ENCOUNTER — Other Ambulatory Visit: Payer: Self-pay | Admitting: Internal Medicine

## 2022-01-04 ENCOUNTER — Other Ambulatory Visit: Payer: Self-pay | Admitting: Internal Medicine

## 2022-01-04 NOTE — Telephone Encounter (Signed)
Please refill as per office routine med refill policy (all routine meds to be refilled for 3 mo or monthly (per pt preference) up to one year from last visit, then month to month grace period for 3 mo, then further med refills will have to be denied) ? ?

## 2022-02-07 ENCOUNTER — Other Ambulatory Visit: Payer: Self-pay | Admitting: Internal Medicine

## 2022-02-07 NOTE — Telephone Encounter (Signed)
Please refill as per office routine med refill policy (all routine meds to be refilled for 3 mo or monthly (per pt preference) up to one year from last visit, then month to month grace period for 3 mo, then further med refills will have to be denied) ? ?

## 2022-03-09 ENCOUNTER — Other Ambulatory Visit: Payer: Self-pay | Admitting: Internal Medicine

## 2022-04-06 ENCOUNTER — Other Ambulatory Visit: Payer: Self-pay | Admitting: Internal Medicine

## 2022-07-24 ENCOUNTER — Emergency Department (HOSPITAL_COMMUNITY)
Admission: EM | Admit: 2022-07-24 | Discharge: 2022-07-25 | Disposition: A | Discharge status: Home / Self Care | Payer: Medicare (Managed Care) | Attending: Emergency Medicine | Admitting: Emergency Medicine

## 2022-07-24 ENCOUNTER — Encounter (HOSPITAL_COMMUNITY): Payer: Self-pay | Admitting: Emergency Medicine

## 2022-07-24 DIAGNOSIS — I10 Essential (primary) hypertension: Secondary | ICD-10-CM | POA: Diagnosis not present

## 2022-07-24 DIAGNOSIS — R42 Dizziness and giddiness: Secondary | ICD-10-CM | POA: Diagnosis present

## 2022-07-24 DIAGNOSIS — M7022 Olecranon bursitis, left elbow: Secondary | ICD-10-CM | POA: Insufficient documentation

## 2022-07-24 DIAGNOSIS — Z79899 Other long term (current) drug therapy: Secondary | ICD-10-CM | POA: Diagnosis not present

## 2022-07-24 DIAGNOSIS — W19XXXA Unspecified fall, initial encounter: Secondary | ICD-10-CM | POA: Insufficient documentation

## 2022-07-24 DIAGNOSIS — R55 Syncope and collapse: Secondary | ICD-10-CM | POA: Diagnosis not present

## 2022-07-24 DIAGNOSIS — Z7982 Long term (current) use of aspirin: Secondary | ICD-10-CM | POA: Diagnosis not present

## 2022-07-24 DIAGNOSIS — M25561 Pain in right knee: Secondary | ICD-10-CM | POA: Insufficient documentation

## 2022-07-24 DIAGNOSIS — M7150 Other bursitis, not elsewhere classified, unspecified site: Secondary | ICD-10-CM

## 2022-07-24 DIAGNOSIS — E860 Dehydration: Secondary | ICD-10-CM | POA: Diagnosis not present

## 2022-07-24 DIAGNOSIS — Y9301 Activity, walking, marching and hiking: Secondary | ICD-10-CM | POA: Diagnosis not present

## 2022-07-24 NOTE — ED Triage Notes (Addendum)
Pt got out of car and felt dizziness episode. THis happened before in past. He reports room is spinning around him. He says walking in straight line he is ok. When he walks and has to turn, it occurs. He has talked to MD about this and adjusted when he takes his BP meds to see if that helps these episodes.  R knee pain and left elbow where he fell. He was in post office alone when it happened so he doesn't know how long out but doesn't think long.  Denies dizziness currently.

## 2022-07-25 ENCOUNTER — Emergency Department (HOSPITAL_COMMUNITY): Payer: Medicare (Managed Care)

## 2022-07-25 LAB — BASIC METABOLIC PANEL
Anion gap: 11 (ref 5–15)
BUN: 11 mg/dL (ref 8–23)
CO2: 27 mmol/L (ref 22–32)
Calcium: 9 mg/dL (ref 8.9–10.3)
Chloride: 99 mmol/L (ref 98–111)
Creatinine, Ser: 1.62 mg/dL — ABNORMAL HIGH (ref 0.61–1.24)
GFR, Estimated: 46 mL/min — ABNORMAL LOW (ref >60)
Glucose, Bld: 151 mg/dL — ABNORMAL HIGH (ref 70–99)
Potassium: 3.3 mmol/L — ABNORMAL LOW (ref 3.5–5.1)
Sodium: 137 mmol/L (ref 135–145)

## 2022-07-25 LAB — CBC
HCT: 35.9 % — ABNORMAL LOW (ref 39.0–52.0)
Hemoglobin: 11.7 g/dL — ABNORMAL LOW (ref 13.0–17.0)
MCH: 29.9 pg (ref 26.0–34.0)
MCHC: 32.6 g/dL (ref 30.0–36.0)
MCV: 91.8 fL (ref 80.0–100.0)
Platelets: 175 10*3/uL (ref 150–400)
RBC: 3.91 MIL/uL — ABNORMAL LOW (ref 4.22–5.81)
RDW: 12.9 % (ref 11.5–15.5)
WBC: 7.6 10*3/uL (ref 4.0–10.5)
nRBC: 0 % (ref 0.0–0.2)

## 2022-07-25 LAB — URINALYSIS, ROUTINE W REFLEX MICROSCOPIC
Bacteria, UA: NONE SEEN
Bilirubin Urine: NEGATIVE
Glucose, UA: NEGATIVE mg/dL
Hgb urine dipstick: NEGATIVE
Ketones, ur: NEGATIVE mg/dL
Leukocytes,Ua: NEGATIVE
Nitrite: NEGATIVE
Protein, ur: NEGATIVE mg/dL
Specific Gravity, Urine: 1.016 (ref 1.005–1.030)
pH: 5 (ref 5.0–8.0)

## 2022-07-25 LAB — CBG MONITORING, ED: Glucose-Capillary: 166 mg/dL — ABNORMAL HIGH (ref 70–99)

## 2022-07-25 MED ORDER — SODIUM CHLORIDE 0.9 % IV BOLUS
1000.0000 mL | Freq: Once | INTRAVENOUS | Status: AC
  Administered 2022-07-25: 1000 mL via INTRAVENOUS

## 2022-07-25 MED ORDER — MECLIZINE HCL 25 MG PO TABS
25.0000 mg | ORAL_TABLET | Freq: Once | ORAL | Status: AC
  Administered 2022-07-25: 25 mg via ORAL
  Filled 2022-07-25: qty 1

## 2022-07-25 NOTE — ED Provider Notes (Signed)
[place] EMERGENCY DEPARTMENT AT [facility] Provider Note   CSN: [id number] Arrival date & time: 07/24/22  2314     History  Chief Complaint  Patient presents with   Weakness    Lance [name] is a 67 y.o. male.  HPI     This is a 67 year old male who presents with a syncopal episode and dizziness.  Patient reports that he has had similar episodes in the past.  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  He states that he has to get out several hallways and turning corners to get his mailbox.  By the time he got to his mailbox he felt very lightheaded and had difficulty putting his key in the mailbox.  He denies room spinning dizziness but feels that the lightheadedness is exacerbated by turning his head left or right.  He describes after feeling lightheaded "waking up on the floor."  He reports pain to the left elbow and right knee.  He is unsure whether he hit his head or lost consciousness.  Denies weakness, numbness, speech difficulty, difficulty ambulating.  Home Medications Prior to Admission medications   Medication Sig Start Date End Date Taking? Authorizing Provider  aspirin EC 81 MG tablet Take 81 mg by mouth in the morning. Swallow whole.    [provider]  atorvastatin (LIPITOR) 40 MG tablet Take 1 tablet by mouth once daily 03/05/21   Corwin Levins, MD  cetirizine (ZYRTEC) 10 MG tablet Take 1 tablet by mouth once daily Patient taking differently: Take 10 mg by mouth in the morning. 06/16/20   Corwin Levins, MD  ketoconazole (NIZORAL) 2 % cream Apply 1 application topically daily. Patient taking differently: Apply 1 application topically daily as needed (skin irritation.). 01/20/20   Corwin Levins, MD  losartan (COZAAR) 100 MG tablet Take 1 tablet (100 mg total) by mouth daily. 07/12/20   Corwin Levins, MD  metFORMIN (GLUCOPHAGE-XR) 500 MG 24 hr tablet Take 1 tablet by mouth once daily 11/14/21   Corwin Levins, MD  omeprazole (PRILOSEC) 40 MG  capsule TAKE 1 CAPSULE BY MOUTH ONCE DAILY . APPOINTMENT REQUIRED FOR FUTURE REFILLS 03/09/22   Corwin Levins, MD  sennosides-docusate sodium (SENOKOT-S) 8.6-50 MG tablet Take 1 tablet by mouth in the morning.    [provider]  triamcinolone (NASACORT) 55 MCG/ACT AERO nasal inhaler Place 2 sprays into the nose daily. Patient taking differently: Place 2 sprays into the nose in the morning. 12/19/16   Corwin Levins, MD  valACYclovir (VALTREX) 1000 MG tablet Take 1 tablet by mouth once daily 03/09/22   Corwin Levins, MD  zolpidem (AMBIEN) 10 MG tablet TAKE 1 TABLET BY MOUTH AT BEDTIME AS NEEDED FOR SLEEP 01/04/21   Corwin Levins, MD      Allergies    Patient has no known allergies.    Review of Systems   Review of Systems  Constitutional:  Negative for fever.  Neurological:  Positive for syncope and light-headedness.  All other systems reviewed and are negative.   Physical Exam Updated Vital Signs BP (!) 162/82   Pulse 63   Temp 98.9 F (37.2 C) (Oral)   Resp 12   SpO2 100%  Physical Exam Vitals and nursing note reviewed.  Constitutional:      Appearance: He is well-developed. He is not ill-appearing.  HENT:     Head: Normocephalic and atraumatic.  Eyes:     Pupils: Pupils are  equal, round, and reactive to light.  Cardiovascular:     Rate and Rhythm: Normal rate and regular rhythm.     Heart sounds: Normal heart sounds. No murmur heard. Pulmonary:     Effort: Pulmonary effort is normal. No respiratory distress.     Breath sounds: Normal breath sounds. No wheezing.  Abdominal:     General: Bowel sounds are normal.     Palpations: Abdomen is soft.     Tenderness: There is no abdominal tenderness. There is no rebound.  Musculoskeletal:     Cervical back: Neck supple.     Comments: Tenderness to palpation over the left olecranon bursa, no overlying skin changes, swelling noted Pain with range of motion of the right knee, no obvious effusion or deformity   Lymphadenopathy:     Cervical: No cervical adenopathy.  Skin:    General: Skin is warm and dry.  Neurological:     Mental Status: He is alert and oriented to person, place, and time.     Comments: Cranial nerves II through XII intact, 5 out of 5 strength in all 4 extremities, no dysmetria to finger-nose-finger  Psychiatric:        Mood and Affect: Mood normal.     ED Results / Procedures / Treatments   Labs (all labs ordered are listed, but only abnormal results are displayed) Labs Reviewed  BASIC METABOLIC PANEL - Abnormal; Notable for the following components:      Result Value   Potassium 3.3 (*)    Glucose, Bld 151 (*)    Creatinine, Ser 1.62 (*)    GFR, Estimated 46 (*)    All other components within normal limits  CBC - Abnormal; Notable for the following components:   RBC 3.91 (*)    Hemoglobin 11.7 (*)    HCT 35.9 (*)    All other components within normal limits  CBG MONITORING, ED - Abnormal; Notable for the following components:   Glucose-Capillary 166 (*)    All other components within normal limits  URINALYSIS, ROUTINE W REFLEX MICROSCOPIC    EKG EKG Interpretation  Date/Time:  Thursday Jul 24 2022 23:32:37 EDT Ventricular Rate:  62 PR Interval:  158 QRS Duration: 78 QT Interval:  444 QTC Calculation: 450 R Axis:   27 Text Interpretation: Normal sinus rhythm ST & T wave abnormality, consider inferior ischemia Abnormal ECG When compared with ECG of 12-Oct-2020 08:28, PREVIOUS ECG IS PRESENT Confirmed by Ross Marcus ([id number]) on 07/25/2022 3:44:02 AM  Radiology DG Knee Complete 4 Views Right  Result Date: 07/25/2022 CLINICAL DATA:  Larey Seat out of car EXAM: RIGHT KNEE - COMPLETE 4+ VIEW COMPARISON:  None Available. FINDINGS: No evidence of fracture, dislocation, or joint effusion. No evidence of arthropathy or other focal bone abnormality. Soft tissues are unremarkable. IMPRESSION: Negative. Electronically Signed   By: Jasmine Pang M.D.   On: 07/25/2022 03:11    DG Elbow Complete Left  Result Date: 07/25/2022 CLINICAL DATA:  Fall dizzy EXAM: LEFT ELBOW - COMPLETE 3+ VIEW COMPARISON:  None Available. FINDINGS: There is no evidence of fracture, dislocation, or joint effusion. There is no evidence of arthropathy or other focal bone abnormality. Soft tissues are unremarkable. Spurring at the olecranon. IMPRESSION: Negative. Electronically Signed   By: Jasmine Pang M.D.   On: 07/25/2022 03:10   CT HEAD WO CONTRAST  Result Date: 07/25/2022 CLINICAL DATA:  Head trauma, GCS=15, loss of consciousness (LOC) (Ped 0-17y) EXAM: CT HEAD WITHOUT CONTRAST TECHNIQUE: Contiguous axial images  were obtained from the base of the skull through the vertex without intravenous contrast. RADIATION DOSE REDUCTION: This exam was performed according to the departmental dose-optimization program which includes automated exposure control, adjustment of the mA and/or kV according to patient size and/or use of iterative reconstruction technique. COMPARISON:  None Available. FINDINGS: Brain: No evidence of acute infarction, hemorrhage, hydrocephalus, extra-axial collection or mass lesion/mass effect. Vascular: No hyperdense vessel or unexpected calcification. Skull: Normal. Negative for fracture or focal lesion. Sinuses/Orbits: Mucous retention cyst within the inferior left maxillary sinus. The paranasal sinuses are otherwise clear. Orbits are unremarkable. Other: Mastoid air cells and middle ear cavities are clear. IMPRESSION: 1. No acute intracranial abnormality. Electronically Signed   By: Helyn Numbers M.D.   On: 07/25/2022 00:42    Procedures Procedures    Medications Ordered in ED Medications  sodium chloride 0.9 % bolus 1,000 mL (0 mLs Intravenous Stopped 07/25/22 0306)  meclizine (ANTIVERT) tablet 25 mg (25 mg Oral Given 07/25/22 0227)    ED Course/ Medical Decision Making/ A&P                             Medical Decision Making Amount and/or Complexity of Data Reviewed Labs:  ordered. Radiology: ordered.   This patient presents to the ED for concern of dizziness, syncope, this involves an extensive number of treatment options, and is a complaint that carries with it a high risk of complications and morbidity.  I considered the following differential and admission for this acute, potentially life threatening condition.  The differential diagnosis includes vasovagal syncope, arrhythmia, BPV vertigo, CVA  MDM:    This is a 67 year old male who presents with an episode of syncope.  Had prodromal symptoms of lightheadedness.  He denies room spinning dizziness but he does endorse that the lightheadedness is associated with turning of his head.  Could be an atypical description of vertigo.  He is neurologically intact.  Have lower suspicion for CVA.  EKG shows no evidence of acute arrhythmia.  Patient does have a slight increase in his creatinine which may represent some AKI.  CT head is negative.  X-rays of the left elbow and right knee.  He does have some evidence of traumatic bursitis of the left elbow.  Recommend ice and anti-inflammatories.  He was given fluids.  He has been ambulatory without difficulty and states he feels at his baseline.  I again have low suspicion for CVA and do not feel he needs MRI at this time.  Recommend fluids at home and follow-up closely with his primary doctor.  (Labs, imaging, consults)  Labs: I Ordered, and personally interpreted labs.  The pertinent results include: CBC, BMP, urinalysis  Imaging Studies ordered: I ordered imaging studies including CT head, x-ray knee, x-ray left elbow I independently visualized and interpreted imaging. I agree with the radiologist interpretation  Additional history obtained from chart review.  External records from outside source obtained and reviewed including prior evaluations  Cardiac Monitoring: The patient was maintained on a cardiac monitor.  If on the cardiac monitor, I personally viewed and  interpreted the cardiac monitored which showed an underlying rhythm of: Sinus rhythm  Reevaluation: After the interventions noted above, I reevaluated the patient and found that they have :resolved  Social Determinants of Health:  lives independently  Disposition: Discharge  Co morbidities that complicate the patient evaluation  Past Medical History:  Diagnosis Date   ALLERGIC RHINITIS  Allergy    Constipation    uses Senna daily    GERD (gastroesophageal reflux disease)    HYPERLIPIDEMIA    Hypertension    Impaired glucose tolerance    OSTEOARTHRITIS, HANDS, BILATERAL    Pre-diabetes      Medicines Meds ordered this encounter  Medications   sodium chloride 0.9 % bolus 1,000 mL   meclizine (ANTIVERT) tablet 25 mg    I have reviewed the patients home medicines and have made adjustments as needed  Problem List / ED Course: Problem List Items Addressed This Visit   None Visit Diagnoses     Vasovagal syncope    -  Primary   Traumatic bursitis       Dehydration                       Final Clinical Impression(s) / ED Diagnoses Final diagnoses:  Vasovagal syncope  Traumatic bursitis  Dehydration    Rx / DC Orders ED Discharge Orders     None         Shon Baton, MD 07/25/22 [phone]

## 2022-07-25 NOTE — Discharge Instructions (Addendum)
You were seen today after episode of dizziness and passing out.  Your labs do indicate that you may be slightly dehydrated.  Make sure that you are drinking plenty of electrolyte rich fluids.  Follow-up closely with your primary doctor.

## 2022-07-25 NOTE — ED Notes (Signed)
Pt ambulated to bathroom with steady gait and no complaints  

## 2022-07-25 NOTE — ED Notes (Signed)
Patient transported to X-ray 

## 2022-08-01 IMAGING — US US SCROTUM W/ DOPPLER COMPLETE
1 series · 14 of 25 positions shown · non-contrast
Comparison: None.

CLINICAL DATA: Enlarged tender left testicle

EXAM:
SCROTAL ULTRASOUND
DOPPLER ULTRASOUND OF THE TESTICLES
TECHNIQUE: Complete ultrasound examination of the testicles, epididymis, and
other scrotal structures was performed. Color and spectral Doppler
ultrasound were also utilized to evaluate blood flow to the
testicles.

[Series 1: us scrotum w/ doppler complete · 14 of 43 slices shown]
[im 1/43]
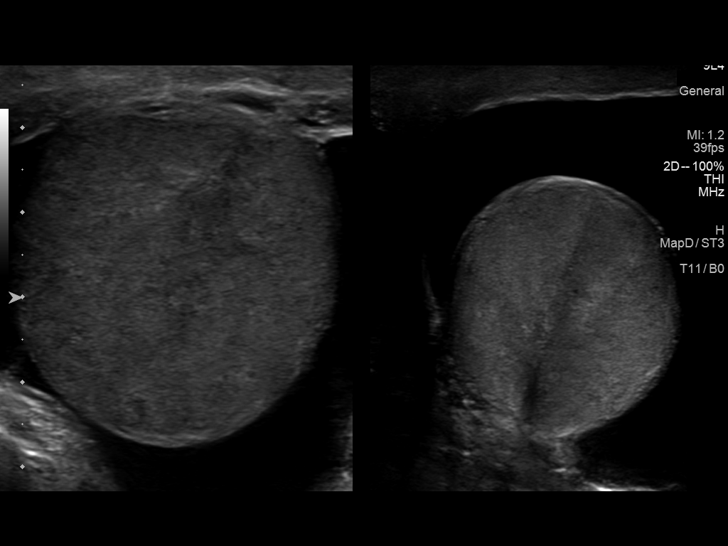
[im 4/43]
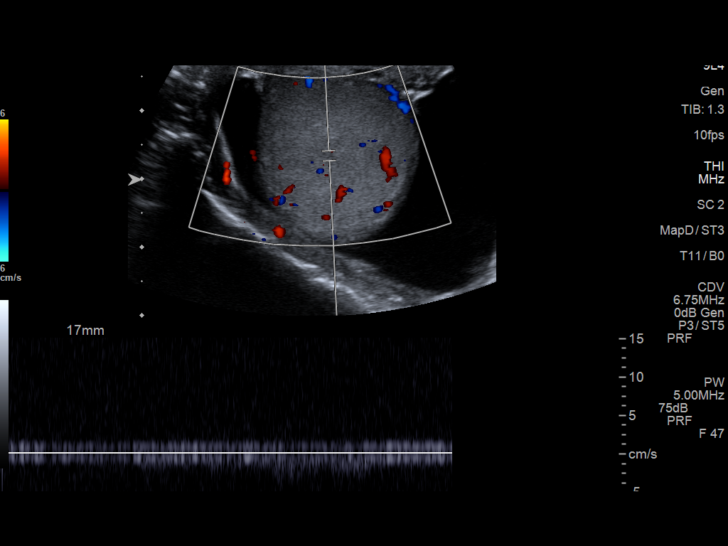
[im 8/43]
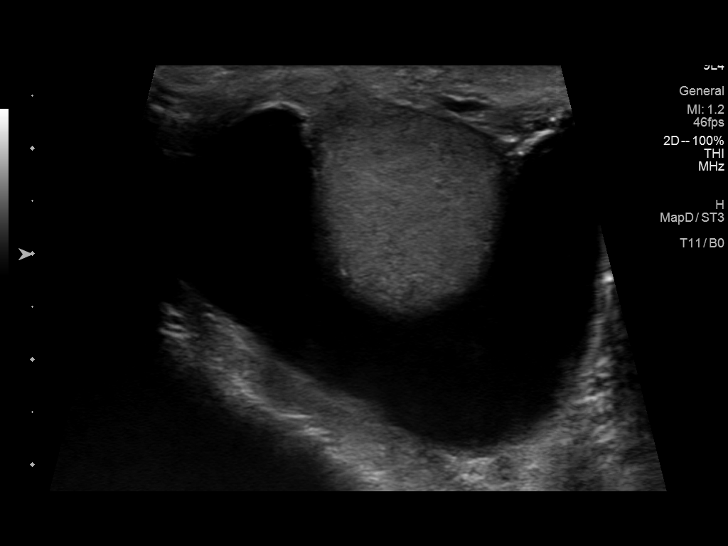
[im 11/43]
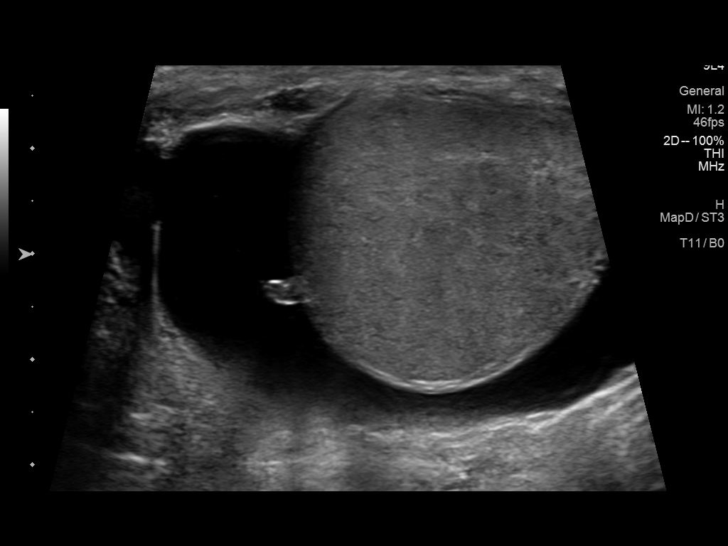
[im 15/43]
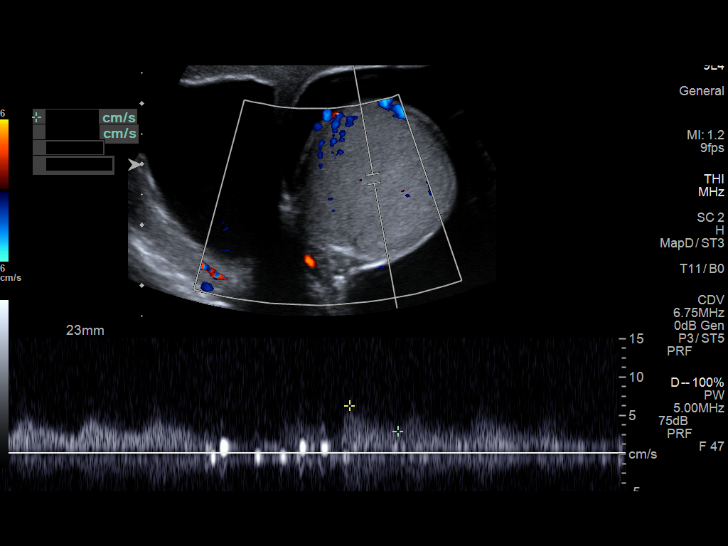
[im 16/43]
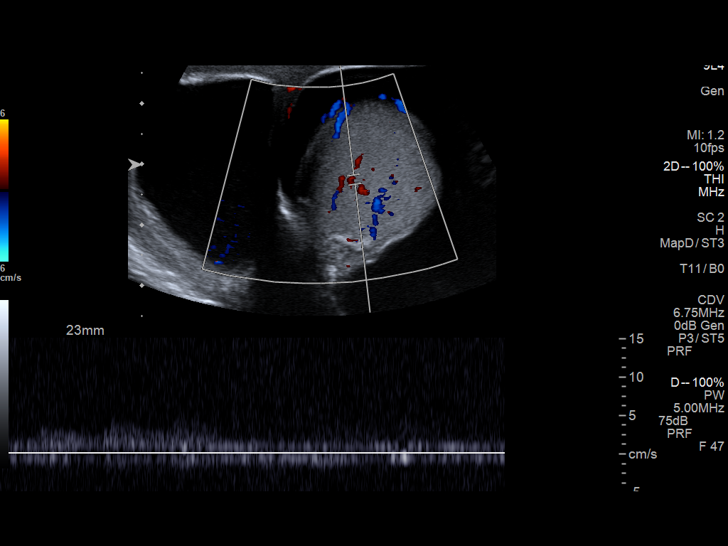
[im 20/43]
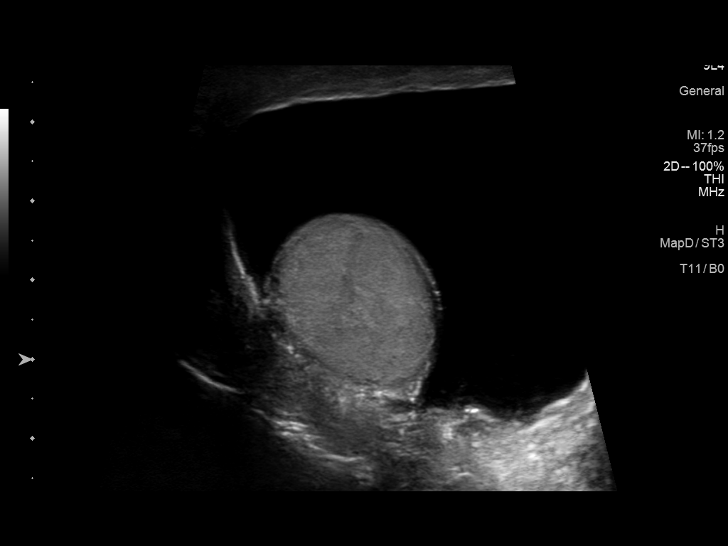
[im 23/43]
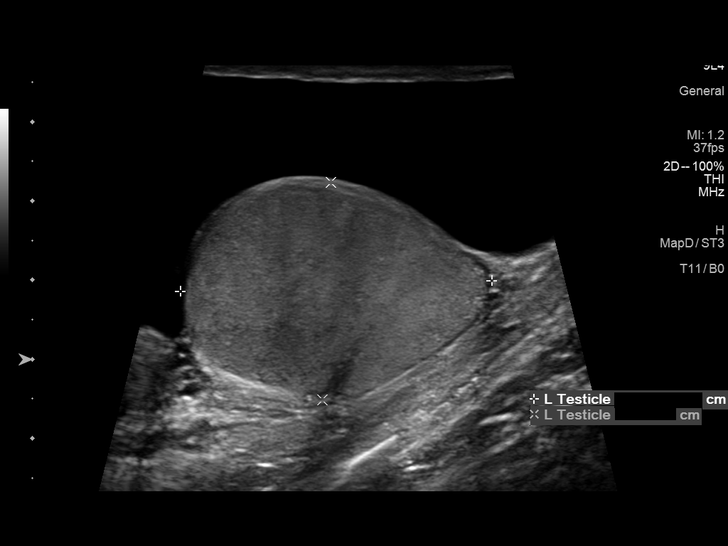
[im 27/43]
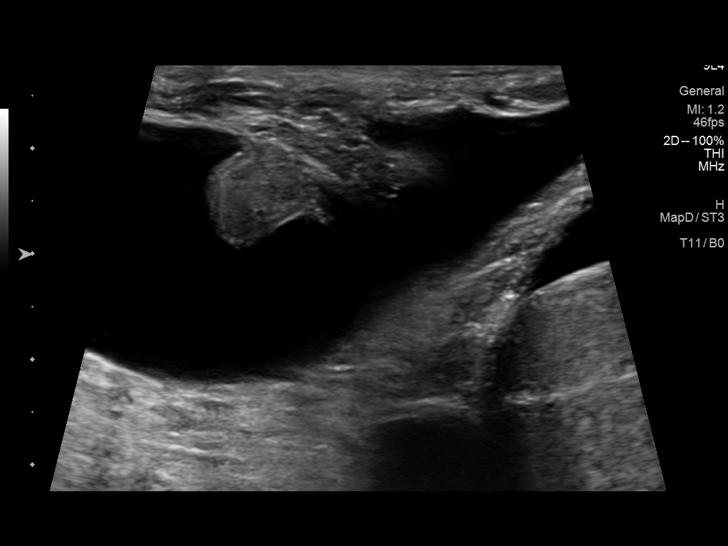
[im 29/43]
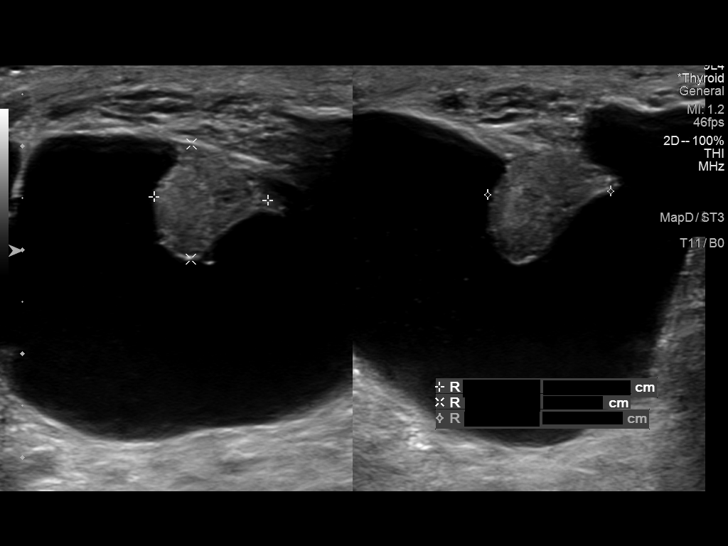
[im 32/43]
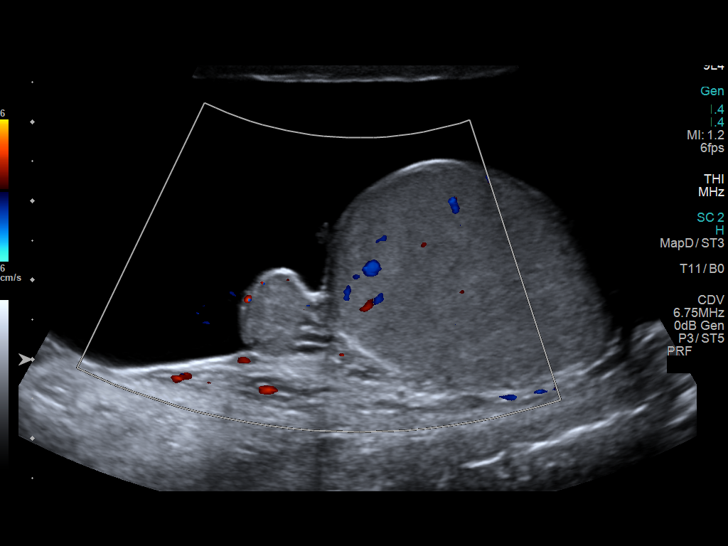
[im 36/43]
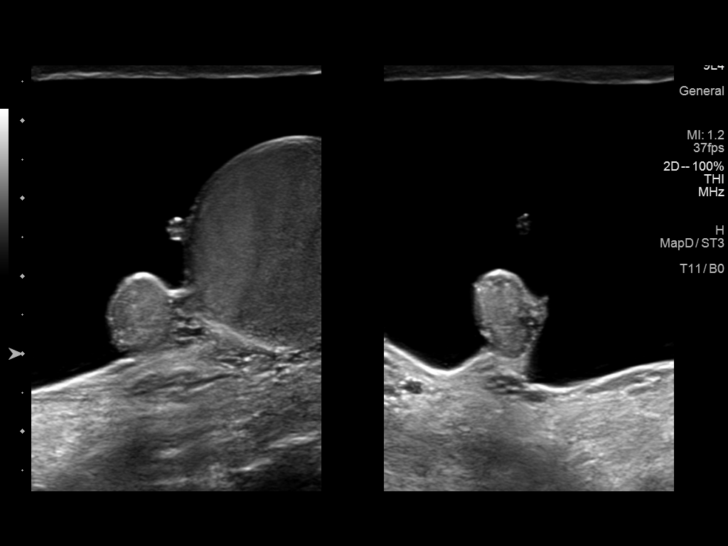
[im 39/43]
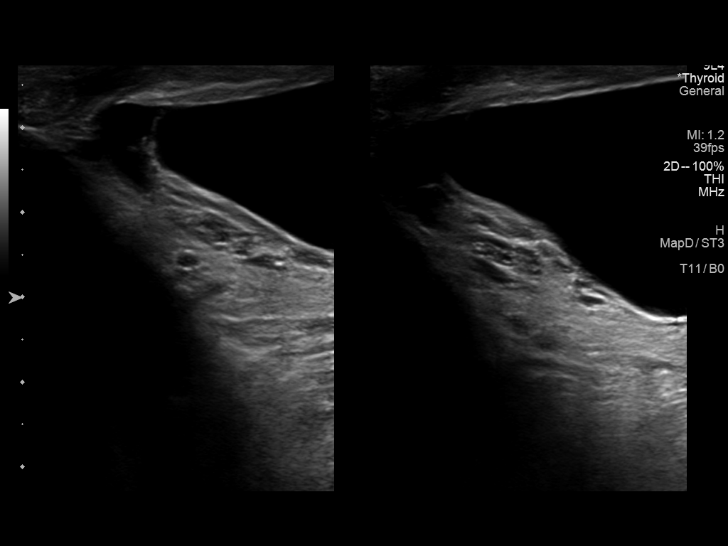
[im 43/43]
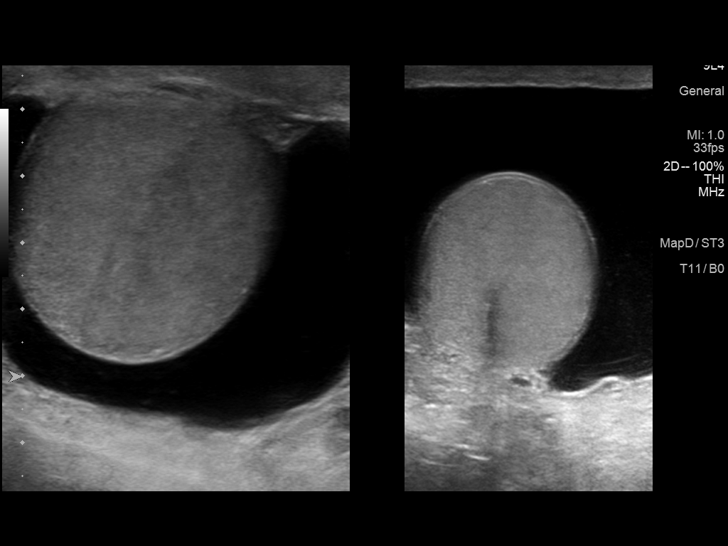

[14 of 25 positions shown; findings below may reference images not displayed]

FINDINGS: Right testicle

Measurements: 3.1 x 2.8 x 2.7 cm. No mass or microlithiasis
visualized.

Left testicle

Measurements: 2.6 x 3.9 x 2.8 cm. No mass or microlithiasis
visualized.

Right epididymis: The right epididymal head is unremarkable. Body
and tail are suboptimally visualized.

Left epididymis: The left epididymal head is unremarkable. Body and
tail are suboptimally visualized.

Hydrocele: There are large bilateral hydroceles, left greater than
right. The fluid is simple in appearance.

Varicocele:  None visualized.

Pulsed Doppler interrogation of both testes demonstrates normal low
resistance arterial and venous waveforms bilaterally.
IMPRESSION: 1. Large bilateral hydroceles, left greater than right.
2. Otherwise unremarkable testicular ultrasound.

## 2022-09-02 ENCOUNTER — Other Ambulatory Visit: Payer: Self-pay | Admitting: Nurse Practitioner

## 2022-09-02 DIAGNOSIS — K4090 Unilateral inguinal hernia, without obstruction or gangrene, not specified as recurrent: Secondary | ICD-10-CM

## 2022-09-22 ENCOUNTER — Ambulatory Visit
Admission: RE | Admit: 2022-09-22 | Discharge: 2022-09-22 | Disposition: A | Discharge status: Home / Self Care | Payer: Medicare (Managed Care) | Source: Ambulatory Visit | Attending: Nurse Practitioner | Admitting: Nurse Practitioner

## 2022-09-22 DIAGNOSIS — K4090 Unilateral inguinal hernia, without obstruction or gangrene, not specified as recurrent: Secondary | ICD-10-CM

## 2022-11-02 ENCOUNTER — Ambulatory Visit
Admission: EM | Admit: 2022-11-02 | Discharge: 2022-11-02 | Disposition: A | Discharge status: Home / Self Care | Payer: Medicare (Managed Care) | Attending: Internal Medicine | Admitting: Internal Medicine

## 2022-11-02 DIAGNOSIS — M62838 Other muscle spasm: Secondary | ICD-10-CM | POA: Diagnosis not present

## 2022-11-02 DIAGNOSIS — T63481A Toxic effect of venom of other arthropod, accidental (unintentional), initial encounter: Secondary | ICD-10-CM | POA: Diagnosis not present

## 2022-11-02 MED ORDER — METHOCARBAMOL 500 MG PO TABS
500.0000 mg | ORAL_TABLET | Freq: Two times a day (BID) | ORAL | 0 refills | Status: DC | PRN
Start: 2022-11-02 — End: 2023-02-13

## 2022-11-02 NOTE — ED Provider Notes (Addendum)
[facility]    CSN: [id number] Arrival date & time: 11/02/22  1514      History   Chief Complaint Chief Complaint  Patient presents with   Insect Bite    HPI Lance [name] is a 67 y.o. male presents for evaluation of muscle pain as well as insect stings.  Patient reports he has chronic neck/shoulder pain and does follow orthopedics for this.  He reports he is on gabapentin.  States he has had increased muscle tension to the area and is requesting something for relief.  Denies any injury.  No numbness/tingling/weakness of his upper extremities.  He has not used any OTC medications for this.  In addition he reports he was stung multiple times by wasps while working in his yard.  States he was stung on his lip, back, arms.  States they are itchy and swelling and feels sore.  He denies any throat/tongue/airway swelling.  No difficulty breathing or swallowing.  He has been stung in the past and denies any history of anaphylaxis with this.  He did take a sleeping pill last night which she states has helped with his symptoms.  He reports his swelling has improved.  Denies any warmth or drainage from the sting sites.  No other concerns at this time.  HPI  Past Medical History:  Diagnosis Date   ALLERGIC RHINITIS    Allergy    Constipation    uses Senna daily    GERD (gastroesophageal reflux disease)    HYPERLIPIDEMIA    Hypertension    Impaired glucose tolerance    OSTEOARTHRITIS, HANDS, BILATERAL    Pre-diabetes     Patient Active Problem List   Diagnosis Date Noted   Left wrist pain 01/28/2021   Orchitis 05/01/2020   Rash 01/21/2020   Asthma 07/16/2019   Right low back pain 01/09/2018   OSA (obstructive sleep apnea) 01/02/2016   Hypersomnolence 11/30/2015   Insomnia 11/05/2013   Abnormal liver function tests 11/04/2013   Venereal disease, unspecified 11/04/2013   Hypertension, uncontrolled 11/04/2013   Diabetes (HCC) 08/16/2011   Encounter for well adult exam  with abnormal findings 08/16/2011   OSTEOARTHRITIS, HANDS, BILATERAL 05/14/2010   Hyperlipidemia 09/13/2007   VENEREAL WART 10/21/2006   Allergic rhinitis 10/21/2006    Past Surgical History:  Procedure Laterality Date   COLONOSCOPY     CYST EXCISION     neck and leg,drained and packed   HYDROCELE EXCISION Left 10/16/2020   Procedure: HYDROCELECTOMY ADULT;  Surgeon: Riki Altes, MD;  Location: [facility] ORS;  Service: Urology;  Laterality: Left;   INGUINAL HERNIA REPAIR  1995   right   POLYPECTOMY         Home Medications    Prior to Admission medications   Medication Sig Start Date End Date Taking? Authorizing Provider  methocarbamol (ROBAXIN) 500 MG tablet Take 1 tablet (500 mg total) by mouth 2 (two) times daily as needed for muscle spasms. 11/02/22  Yes Radford Pax, NP  aspirin EC 81 MG tablet Take 81 mg by mouth in the morning. Swallow whole.    [provider]  atorvastatin (LIPITOR) 40 MG tablet Take 1 tablet by mouth once daily 03/05/21   Corwin Levins, MD  cetirizine (ZYRTEC) 10 MG tablet Take 1 tablet by mouth once daily Patient taking differently: Take 10 mg by mouth in the morning. 06/16/20   Corwin Levins, MD  ketoconazole (NIZORAL) 2 % cream Apply 1 application topically daily. Patient  taking differently: Apply 1 application  topically daily as needed (skin irritation.). 01/20/20   Corwin Levins, MD  losartan (COZAAR) 100 MG tablet Take 1 tablet (100 mg total) by mouth daily. 07/12/20   Corwin Levins, MD  metFORMIN (GLUCOPHAGE-XR) 500 MG 24 hr tablet Take 1 tablet by mouth once daily 11/14/21   Corwin Levins, MD  omeprazole (PRILOSEC) 40 MG capsule TAKE 1 CAPSULE BY MOUTH ONCE DAILY . APPOINTMENT REQUIRED FOR FUTURE REFILLS 03/09/22   Corwin Levins, MD  sennosides-docusate sodium (SENOKOT-S) 8.6-50 MG tablet Take 1 tablet by mouth in the morning.    [provider]  triamcinolone (NASACORT) 55 MCG/ACT AERO nasal inhaler Place 2 sprays into the nose  daily. Patient taking differently: Place 2 sprays into the nose in the morning. 12/19/16   Corwin Levins, MD  valACYclovir (VALTREX) 1000 MG tablet Take 1 tablet by mouth once daily 03/09/22   Corwin Levins, MD  zolpidem (AMBIEN) 10 MG tablet TAKE 1 TABLET BY MOUTH AT BEDTIME AS NEEDED FOR SLEEP 01/04/21   Corwin Levins, MD    Family History Family History  Problem Relation Age of Onset   Hypertension Other    Diabetes Other    Hypothyroidism Other    Aneurysm Other    Prostate cancer Father    Cancer Sister        unsure of type    Lung cancer Brother    Lung cancer Sister    Kidney disease Mother    Colon cancer Neg Hx    Stomach cancer Neg Hx    Colon polyps Neg Hx    Esophageal cancer Neg Hx    Rectal cancer Neg Hx     Social History Social History   Tobacco Use   Smoking status: Never   Smokeless tobacco: Never  Vaping Use   Vaping status: Never Used  Substance Use Topics   Alcohol use: No   Drug use: No     Allergies   Patient has no known allergies.   Review of Systems Review of Systems  Musculoskeletal:        Right-sided neck and shoulder pain/chronic  Skin:        Insect stings to multiple sites     Physical Exam Triage Vital Signs ED Triage Vitals  Encounter Vitals Group     BP 11/02/22 1525 122/78     Systolic BP Percentile --      Diastolic BP Percentile --      Pulse Rate 11/02/22 1525 63     Resp 11/02/22 1525 18     Temp 11/02/22 1531 98.1 F (36.7 C)     Temp Source 11/02/22 1525 Oral     SpO2 11/02/22 1525 97 %     Weight --      Height --      Head Circumference --      Peak Flow --      Pain Score 11/02/22 1529 5     Pain Loc --      Pain Education --      Exclude from Growth Chart --    No data found.  Updated Vital Signs BP 122/78 (BP Location: Right Arm)   Pulse 63   Temp 98.1 F (36.7 C) (Oral)   Resp 18   SpO2 97%   Visual Acuity Right Eye Distance:   Left Eye Distance:   Bilateral Distance:    Right Eye  Near:  Left Eye Near:    Bilateral Near:     Physical Exam Vitals and nursing note reviewed.  Constitutional:      General: He is not in acute distress.    Appearance: Normal appearance. He is not ill-appearing, toxic-appearing or diaphoretic.  HENT:     Head: Normocephalic and atraumatic.     Comments: No facial swelling    Mouth/Throat:     Pharynx: Oropharynx is clear. Uvula midline. No pharyngeal swelling or uvula swelling.      Comments: Mild swelling of the left lower lip secondary to insect sting.  No erythema or warmth. Eyes:     Pupils: Pupils are equal, round, and reactive to light.  Cardiovascular:     Rate and Rhythm: Normal rate and regular rhythm.     Heart sounds: Normal heart sounds.  Pulmonary:     Effort: Pulmonary effort is normal.     Breath sounds: Normal breath sounds.  Musculoskeletal:       Arms:     Cervical back: No swelling, rigidity, torticollis or bony tenderness.     Comments: There is no swelling, erythema, warmth of the right para cervical muscles or right trapezius.  Area is mildly tender to palpation with some muscle spasms noted.  Full range of motion of neck without restriction.  Strength is 5 out of 5 bilateral upper extremities.  Skin:    General: Skin is warm and dry.     Comments: There are multiple insect stings to bilateral lower arms, back, and 1 to the left lower lip.  There is mild swelling to these areas without erythema or warmth.  Neurological:     General: No focal deficit present.     Mental Status: He is alert and oriented to person, place, and time.  Psychiatric:        Mood and Affect: Mood normal.        Behavior: Behavior normal.      UC Treatments / Results  Labs (all labs ordered are listed, but only abnormal results are displayed) Labs Reviewed - No data to display  Basic metabolic panel Order: [id number] Status: Final result     Visible to patient: Yes (not seen)     Next appt: None   0 Result Notes      1 HM Topic          Component Ref Range & Units 3 mo ago (07/24/22) 1 yr ago (01/28/21) 2 yr ago (10/12/20) 2 yr ago (07/09/20) 2 yr ago (01/16/20) 3 yr ago (07/11/19) 3 yr ago (01/10/19)  Sodium 135 - 145 mmol/L 137 141 R 139 140 R 140 R 139 R 140 R  Potassium 3.5 - 5.1 mmol/L 3.3 Low  4.1 R 3.8 4.0 R 4.0 R 4.1 R 4.5 R  Chloride 98 - 111 mmol/L 99 103 R 105 104 R 103 R 100 R 101 R  CO2 22 - 32 mmol/L 27 32 R 29 31 R 28 R 33 High  R 29 R  Glucose, Bld 70 - 99 mg/dL 782 High  956 High  213 High  CM 129 High  118 High  123 High  111 High   Comment: Glucose reference range applies only to samples taken after fasting for at least 8 hours.  BUN 8 - 23 mg/dL 11 10 R 20 11 R 10 R 10 R 15 R  Creatinine, Ser 0.61 - 1.24 mg/dL 0.86 High  5.78 R 4.69 High  1.24 R 1.23  R 1.13 R 1.24 R  Calcium 8.9 - 10.3 mg/dL 9.0 9.2 R 9.0 9.3 R 9.4 R 9.5 R 9.7 R  GFR, Estimated >60 mL/min 46 Low   >60 CM      Comment: (NOTE) Calculated using the CKD-EPI Creatinine Equation (2021)  Anion gap 5 - 15 11  5  CM      Comment: Performed at [facility], [address], [place], [place] [address]  Resulting Agency [facility] [place] [facility] [place] [facility] [place] [facility] [place] [facility]         Specimen Collected: 07/24/22 23:54 Last Resulted: 07/25/22 00:31      EKG   Radiology No results found.  Procedures Procedures (including critical care time)  Medications Ordered in UC Medications - No data to display  Initial Impression / Assessment and Plan / UC Course  I have reviewed the triage vital signs and the nursing notes.  Pertinent labs & imaging results that were available during my care of the patient were reviewed by me and considered in my medical decision making (see chart for details).     Reviewed exam and symptoms with patient.  No red flags.  He is patent.  Discussed local reactions to the insect stings.  No signs or symptoms of infection at this  time.  Unable to give Toradol in clinic for his muscle pain secondary to his kidney function.  Discussed trial of muscle relaxers.  Side effect profile reviewed.  Encouraged heat to the shoulder as needed.  He should follow-up with his orthopedics for further treatment options of this chronic issue.  I also advise he follow-up with his PCP in 2 days for recheck.  Strict ER precautions reviewed and patient verbalized understanding. Final Clinical Impressions(s) / UC Diagnoses   Final diagnoses:  Local reaction to insect sting, accidental or unintentional, initial encounter  Muscle spasm     Discharge Instructions      You may take Robaxin twice daily as needed for your muscle spasm.  Apply heat to the area as needed.  You may use over-the-counter hydrocortisone cream to the insect sting areas.  There is no signs of infection at this time.  Please follow-up with your PCP in 2 days for recheck.  Please go to the ER for any worsening symptoms.  Hope you feel better soon!    ED Prescriptions     Medication Sig Dispense Auth. Provider   methocarbamol (ROBAXIN) 500 MG tablet Take 1 tablet (500 mg total) by mouth 2 (two) times daily as needed for muscle spasms. 10 tablet Radford Pax, NP      PDMP not reviewed this encounter.   Radford Pax, NP 11/02/22 1552    Radford Pax, NP 11/02/22 [phone]

## 2022-11-02 NOTE — ED Triage Notes (Signed)
Pt reports he was bit by wasps yesterday in the lip, ear, back, neck, arms. States the areas bit by the wasps are swelling, itchy and soreness.

## 2022-11-02 NOTE — Discharge Instructions (Signed)
You may take Robaxin twice daily as needed for your muscle spasm.  Apply heat to the area as needed.  You may use over-the-counter hydrocortisone cream to the insect sting areas.  There is no signs of infection at this time.  Please follow-up with your PCP in 2 days for recheck.  Please go to the ER for any worsening symptoms.  Hope you feel better soon!

## 2023-02-06 ENCOUNTER — Ambulatory Visit: Payer: Self-pay | Admitting: Surgery

## 2023-02-06 NOTE — H&P (Signed)
Subjective    Chief Complaint: New Consultation [facility] )       History of Present Illness: Lance [name] is a 67 y.o. male who is seen today as an office consultation at the request of Dr. Delia Chimes for evaluation of New Consultation [facility] ) .     This is a 67 year old male with a history of a laparoscopic right inguinal hernia repair with mesh in 1995 who presents with a 2-year history of an enlarging bulge in his left groin.  This has become quite large and extends down towards the scrotum.  He has some chronic constipation and uses a laxative on a daily basis.  He denies any urinary symptoms.  He presents now for evaluation for hernia repair.   Review of Systems: A complete review of systems was obtained from the patient.  I have reviewed this information and discussed as appropriate with the patient.  See HPI as well for other ROS.   Review of Systems  Constitutional: Negative.   HENT: Negative.    Eyes: Negative.   Respiratory: Negative.    Cardiovascular: Negative.   Gastrointestinal:  Positive for constipation.  Genitourinary: Negative.   Musculoskeletal: Negative.   Skin: Negative.   Neurological: Negative.   Endo/Heme/Allergies: Negative.   Psychiatric/Behavioral: Negative.          Medical History: Past Medical History      Past Medical History:  Diagnosis Date   Diabetes mellitus without complication (CMS/HHS-HCC)     Hyperlipidemia     Hypertension          Problem List     Patient Active Problem List  Diagnosis   Abnormal liver function tests   Allergic rhinitis   Asthma (HHS-HCC)   Diabetes (CMS/HHS-HCC)   Hyperlipidemia   Hypersomnolence   Inguinal hernia   Osteoarthritis of hands, bilateral   OSA (obstructive sleep apnea)        Past Surgical History       Past Surgical History:  Procedure Laterality Date   CATARACT EXTRACTION       HERNIA REPAIR       Scrotum Reduction            Allergies  No Known Allergies     Medications  Ordered Prior to Encounter        Current Outpatient Medications on File Prior to Visit  Medication Sig Dispense Refill   amLODIPine (NORVASC) 5 MG tablet Take 5 mg by mouth every evening       atorvastatin (LIPITOR) 40 MG tablet Take 40 mg by mouth once daily       cetirizine (ZYRTEC) 10 MG tablet Take 10 mg by mouth every evening       fluticasone propionate (FLONASE) 50 mcg/actuation nasal spray Place 1 spray into one nostril at bedtime as needed       losartan-hydroCHLOROthiazide (HYZAAR) 100-25 mg tablet Take 1 tablet by mouth every morning       metFORMIN (GLUCOPHAGE-XR) 500 MG XR tablet Take 500 mg by mouth daily with breakfast       metoprolol TARTrate (LOPRESSOR) 100 MG tablet Take 100 mg by mouth 2 (two) times daily       omeprazole (PRILOSEC) 40 MG DR capsule Take 40 mg by mouth once daily        No current facility-administered medications on file prior to visit.        Family History       Family History  Problem Relation Age of  Onset   High blood pressure (Hypertension) Mother     High blood pressure (Hypertension) Sister     Diabetes Sister     Breast cancer Sister     Breast cancer Brother          Tobacco Use History  Social History       Tobacco Use  Smoking Status Never  Smokeless Tobacco Never        Social History  Social History        Socioeconomic History   Marital status: Married  Tobacco Use   Smoking status: Never   Smokeless tobacco: Never  Vaping Use   Vaping status: Never Used  Substance and Sexual Activity   Alcohol use: Not Currently   Drug use: Never    Social Drivers of Health      Received from [employer]    Social Network        Objective:          Vitals:    02/06/23 0959 02/06/23 1002  BP: 130/80    Pulse: 70    Temp: 36.8 C (98.3 F)    SpO2: 98%    Weight: 100.2 kg (221 lb)    Height: 179.1 cm (5' 10.5")    PainSc:   0-No pain    Body mass index is 31.26 kg/m.   Physical Exam    Constitutional:   WDWN in NAD, conversant, no obvious deformities; lying in bed comfortably Eyes:  Pupils equal, round; sclera anicteric; moist conjunctiva; no lid lag HENT:  Oral mucosa moist; good dentition  Neck:  No masses palpated, trachea midline; no thyromegaly Lungs:  CTA bilaterally; normal respiratory effort CV:  Regular rate and rhythm; no murmurs; extremities well-perfused with no edema Abd:  +bowel sounds, soft, non-tender, no palpable organomegaly; small palpable hernia at the upper umbilicus  GU: Bilateral send the testes, no testicular masses, very large left inguinal hernia extending down to the scrotum.  This is partially reducible when he is supine.  No sign of recurrent right inguinal hernia. Musc:  Normal gait; no apparent clubbing or cyanosis in extremities Lymphatic:  No palpable cervical or axillary lymphadenopathy Skin:  Warm, dry; no sign of jaundice Psychiatric - alert and oriented x 4; calm mood and affect     Assessment and Plan:  Diagnoses and all orders for this visit:   Left inguinal hernia   Asymptomatic umbilical hernia   We will plan left inguinal hernia repair with mesh.The surgical procedure has been discussed with the patient.  Potential risks, benefits, alternative treatments, and expected outcomes have been explained.  All of the patient's questions at this time have been answered.  The likelihood of reaching the patient's treatment goal is good.  The patient understands the proposed surgical procedure and wishes to proceed.   [name] Delbert Harness, MD  02/06/2023 10:27 AM

## 2023-02-06 NOTE — H&P (View-Only) (Signed)
 Subjective    Chief Complaint: New Consultation [facility] )       History of Present Illness: Lance [name] is a 67 y.o. male who is seen today as an office consultation at the request of Dr. Delia Chimes for evaluation of New Consultation [facility] ) .     This is a 67 year old male with a history of a laparoscopic right inguinal hernia repair with mesh in 1995 who presents with a 2-year history of an enlarging bulge in his left groin.  This has become quite large and extends down towards the scrotum.  He has some chronic constipation and uses a laxative on a daily basis.  He denies any urinary symptoms.  He presents now for evaluation for hernia repair.   Review of Systems: A complete review of systems was obtained from the patient.  I have reviewed this information and discussed as appropriate with the patient.  See HPI as well for other ROS.   Review of Systems  Constitutional: Negative.   HENT: Negative.    Eyes: Negative.   Respiratory: Negative.    Cardiovascular: Negative.   Gastrointestinal:  Positive for constipation.  Genitourinary: Negative.   Musculoskeletal: Negative.   Skin: Negative.   Neurological: Negative.   Endo/Heme/Allergies: Negative.   Psychiatric/Behavioral: Negative.          Medical History: Past Medical History      Past Medical History:  Diagnosis Date   Diabetes mellitus without complication (CMS/HHS-HCC)     Hyperlipidemia     Hypertension          Problem List     Patient Active Problem List  Diagnosis   Abnormal liver function tests   Allergic rhinitis   Asthma (HHS-HCC)   Diabetes (CMS/HHS-HCC)   Hyperlipidemia   Hypersomnolence   Inguinal hernia   Osteoarthritis of hands, bilateral   OSA (obstructive sleep apnea)        Past Surgical History       Past Surgical History:  Procedure Laterality Date   CATARACT EXTRACTION       HERNIA REPAIR       Scrotum Reduction            Allergies  No Known Allergies     Medications  Ordered Prior to Encounter        Current Outpatient Medications on File Prior to Visit  Medication Sig Dispense Refill   amLODIPine (NORVASC) 5 MG tablet Take 5 mg by mouth every evening       atorvastatin (LIPITOR) 40 MG tablet Take 40 mg by mouth once daily       cetirizine (ZYRTEC) 10 MG tablet Take 10 mg by mouth every evening       fluticasone propionate (FLONASE) 50 mcg/actuation nasal spray Place 1 spray into one nostril at bedtime as needed       losartan-hydroCHLOROthiazide (HYZAAR) 100-25 mg tablet Take 1 tablet by mouth every morning       metFORMIN (GLUCOPHAGE-XR) 500 MG XR tablet Take 500 mg by mouth daily with breakfast       metoprolol TARTrate (LOPRESSOR) 100 MG tablet Take 100 mg by mouth 2 (two) times daily       omeprazole (PRILOSEC) 40 MG DR capsule Take 40 mg by mouth once daily        No current facility-administered medications on file prior to visit.        Family History       Family History  Problem Relation Age of  Onset   High blood pressure (Hypertension) Mother     High blood pressure (Hypertension) Sister     Diabetes Sister     Breast cancer Sister     Breast cancer Brother          Tobacco Use History  Social History       Tobacco Use  Smoking Status Never  Smokeless Tobacco Never        Social History  Social History        Socioeconomic History   Marital status: Married  Tobacco Use   Smoking status: Never   Smokeless tobacco: Never  Vaping Use   Vaping status: Never Used  Substance and Sexual Activity   Alcohol use: Not Currently   Drug use: Never    Social Drivers of Health      Received from [employer]    Social Network        Objective:          Vitals:    02/06/23 0959 02/06/23 1002  BP: 130/80    Pulse: 70    Temp: 36.8 C (98.3 F)    SpO2: 98%    Weight: 100.2 kg (221 lb)    Height: 179.1 cm (5' 10.5")    PainSc:   0-No pain    Body mass index is 31.26 kg/m.   Physical Exam    Constitutional:   WDWN in NAD, conversant, no obvious deformities; lying in bed comfortably Eyes:  Pupils equal, round; sclera anicteric; moist conjunctiva; no lid lag HENT:  Oral mucosa moist; good dentition  Neck:  No masses palpated, trachea midline; no thyromegaly Lungs:  CTA bilaterally; normal respiratory effort CV:  Regular rate and rhythm; no murmurs; extremities well-perfused with no edema Abd:  +bowel sounds, soft, non-tender, no palpable organomegaly; small palpable hernia at the upper umbilicus  GU: Bilateral send the testes, no testicular masses, very large left inguinal hernia extending down to the scrotum.  This is partially reducible when he is supine.  No sign of recurrent right inguinal hernia. Musc:  Normal gait; no apparent clubbing or cyanosis in extremities Lymphatic:  No palpable cervical or axillary lymphadenopathy Skin:  Warm, dry; no sign of jaundice Psychiatric - alert and oriented x 4; calm mood and affect     Assessment and Plan:  Diagnoses and all orders for this visit:   Left inguinal hernia   Asymptomatic umbilical hernia   We will plan left inguinal hernia repair with mesh.The surgical procedure has been discussed with the patient.  Potential risks, benefits, alternative treatments, and expected outcomes have been explained.  All of the patient's questions at this time have been answered.  The likelihood of reaching the patient's treatment goal is good.  The patient understands the proposed surgical procedure and wishes to proceed.   [name] Delbert Harness, MD  02/06/2023 10:27 AM

## 2023-02-13 NOTE — Patient Instructions (Signed)
DUE TO COVID-19 ONLY TWO VISITORS  (aged 69 and older)  ARE ALLOWED TO COME WITH YOU AND STAY IN THE WAITING ROOM ONLY DURING PRE OP AND PROCEDURE.   **NO VISITORS ARE ALLOWED IN THE SHORT STAY AREA OR RECOVERY ROOM!!**  IF YOU WILL BE ADMITTED INTO THE HOSPITAL YOU ARE ALLOWED ONLY FOUR SUPPORT PEOPLE DURING VISITATION HOURS ONLY (7 AM -8PM)   The support person(s) must pass our screening, gel in and out, and wear a mask at all times, including in the patient's room. Patients must also wear a mask when staff or their support person are in the room. Visitors GUEST BADGE MUST BE WORN VISIBLY  One adult visitor may remain with you overnight and MUST be in the room by 8 P.M.     Your procedure is scheduled on: 02/26/23   Report to [facility] Main Entrance    Report to admitting at : 7:15 AM   Call this number if you have problems the morning of surgery [phone]   Do not eat food or drink: After Midnight.  FOLLOW ANY ADDITIONAL PRE OP INSTRUCTIONS YOU RECEIVED FROM YOUR SURGEON'S OFFICE!!!   Oral Hygiene is also important to reduce your risk of infection.                                    Remember - BRUSH YOUR TEETH THE MORNING OF SURGERY WITH YOUR REGULAR TOOTHPASTE  DENTURES WILL BE REMOVED PRIOR TO SURGERY PLEASE DO NOT APPLY "Poly grip" OR ADHESIVES!!!   Do NOT smoke after Midnight   Take these medicines the morning of surgery with A SIP OF WATER: cetirizine,metoprolol,amlodipine,omeprazole.  DO NOT TAKE ANY ORAL DIABETIC MEDICATIONS DAY OF YOUR SURGERY                              You may not have any metal on your body including hair pins, jewelry, and body piercing             Do not wear lotions, powders, perfumes/cologne, or deodorant              Men may shave face and neck.   Do not bring valuables to the hospital. [place] IS NOT             RESPONSIBLE   FOR VALUABLES.   Contacts, glasses, or bridgework may not be worn into surgery.   Bring small  overnight bag day of surgery.   DO NOT BRING YOUR HOME MEDICATIONS TO THE HOSPITAL. PHARMACY WILL DISPENSE MEDICATIONS LISTED ON YOUR MEDICATION LIST TO YOU DURING YOUR ADMISSION IN THE HOSPITAL!    Patients discharged on the day of surgery will not be allowed to drive home.  Someone NEEDS to stay with you for the first 24 hours after anesthesia.   Special Instructions: Bring a copy of your healthcare power of attorney and living will documents         the day of surgery if you haven't scanned them before.              Please read over the following fact sheets you were given: IF YOU HAVE QUESTIONS ABOUT YOUR PRE-OP INSTRUCTIONS PLEASE CALL [phone]    [facility] - Preparing for Surgery Before surgery, you can play an important role.  Because skin is not sterile, your skin needs  to be as free of germs as possible.  You can reduce the number of germs on your skin by washing with CHG (chlorahexidine gluconate) soap before surgery.  CHG is an antiseptic cleaner which kills germs and bonds with the skin to continue killing germs even after washing. Please DO NOT use if you have an allergy to CHG or antibacterial soaps.  If your skin becomes reddened/irritated stop using the CHG and inform your nurse when you arrive at Short Stay. Do not shave (including legs and underarms) for at least 48 hours prior to the first CHG shower.  You may shave your face/neck. Please follow these instructions carefully:  1.  Shower with CHG Soap the night before surgery and the  morning of Surgery.  2.  If you choose to wash your hair, wash your hair first as usual with your  normal  shampoo.  3.  After you shampoo, rinse your hair and body thoroughly to remove the  shampoo.                           4.  Use CHG as you would any other liquid soap.  You can apply chg directly  to the skin and wash                       Gently with a scrungie or clean washcloth.  5.  Apply the CHG Soap to your body ONLY FROM THE NECK  DOWN.   Do not use on face/ open                           Wound or open sores. Avoid contact with eyes, ears mouth and genitals (private parts).                       Wash face,  Genitals (private parts) with your normal soap.             6.  Wash thoroughly, paying special attention to the area where your surgery  will be performed.  7.  Thoroughly rinse your body with warm water from the neck down.  8.  DO NOT shower/wash with your normal soap after using and rinsing off  the CHG Soap.                9.  Pat yourself dry with a clean towel.            10.  Wear clean pajamas.            11.  Place clean sheets on your bed the night of your first shower and do not  sleep with pets. Day of Surgery : Do not apply any lotions/deodorants the morning of surgery.  Please wear clean clothes to the hospital/surgery center.  FAILURE TO FOLLOW THESE INSTRUCTIONS MAY RESULT IN THE CANCELLATION OF YOUR SURGERY PATIENT SIGNATURE_________________________________  NURSE SIGNATURE__________________________________  ________________________________________________________________________

## 2023-02-16 ENCOUNTER — Other Ambulatory Visit: Payer: Self-pay

## 2023-02-16 ENCOUNTER — Encounter (HOSPITAL_COMMUNITY): Payer: Self-pay

## 2023-02-16 ENCOUNTER — Encounter (HOSPITAL_COMMUNITY)
Admission: RE | Admit: 2023-02-16 | Discharge: 2023-02-16 | Disposition: A | Discharge status: Home / Self Care | Payer: Medicare (Managed Care) | Source: Ambulatory Visit | Attending: Surgery

## 2023-02-16 ENCOUNTER — Encounter: Payer: Self-pay | Admitting: Internal Medicine

## 2023-02-16 VITALS — BP 130/88 | HR 62 | Temp 98.8°F | Ht 70.5 in | Wt 217.0 lb

## 2023-02-16 DIAGNOSIS — Z01812 Encounter for preprocedural laboratory examination: Secondary | ICD-10-CM | POA: Diagnosis present

## 2023-02-16 DIAGNOSIS — E1165 Type 2 diabetes mellitus with hyperglycemia: Secondary | ICD-10-CM | POA: Insufficient documentation

## 2023-02-16 DIAGNOSIS — I10 Essential (primary) hypertension: Secondary | ICD-10-CM | POA: Insufficient documentation

## 2023-02-16 HISTORY — DX: Type 2 diabetes mellitus without complications: E11.9

## 2023-02-16 LAB — HEMOGLOBIN A1C
Hgb A1c MFr Bld: 7.1 % — ABNORMAL HIGH (ref 4.8–5.6)
Mean Plasma Glucose: 157.07 mg/dL

## 2023-02-16 LAB — CBC
HCT: 39.6 % (ref 39.0–52.0)
Hemoglobin: 12.9 g/dL — ABNORMAL LOW (ref 13.0–17.0)
MCH: 30 pg (ref 26.0–34.0)
MCHC: 32.6 g/dL (ref 30.0–36.0)
MCV: 92.1 fL (ref 80.0–100.0)
Platelets: 194 10*3/uL (ref 150–400)
RBC: 4.3 MIL/uL (ref 4.22–5.81)
RDW: 12.9 % (ref 11.5–15.5)
WBC: 6.4 10*3/uL (ref 4.0–10.5)
nRBC: 0 % (ref 0.0–0.2)

## 2023-02-16 LAB — BASIC METABOLIC PANEL
Anion gap: 5 (ref 5–15)
BUN: 17 mg/dL (ref 8–23)
CO2: 28 mmol/L (ref 22–32)
Calcium: 9.4 mg/dL (ref 8.9–10.3)
Chloride: 104 mmol/L (ref 98–111)
Creatinine, Ser: 1.54 mg/dL — ABNORMAL HIGH (ref 0.61–1.24)
GFR, Estimated: 49 mL/min — ABNORMAL LOW (ref >60)
Glucose, Bld: 137 mg/dL — ABNORMAL HIGH (ref 70–99)
Potassium: 3.4 mmol/L — ABNORMAL LOW (ref 3.5–5.1)
Sodium: 137 mmol/L (ref 135–145)

## 2023-02-16 LAB — GLUCOSE, CAPILLARY: Glucose-Capillary: 154 mg/dL — ABNORMAL HIGH (ref 70–99)

## 2023-02-16 NOTE — Progress Notes (Signed)
For Anesthesia: PCP - Corwin Levins, MD  Cardiologist - N/A  Bowel Prep reminder:  Chest x-ray -  EKG - 07/25/27 Stress Test -  ECHO -  Cardiac Cath -  Pacemaker/ICD device last checked: Pacemaker orders received: Device Rep notified:  Spinal Cord Stimulator: N/A  Sleep Study - N/A CPAP -   Fasting Blood Sugar - N/A Checks Blood Sugar ___0__ times a day Date and result of last Hgb A1c- 7.4: 04/10/22  Last dose of GLP1 agonist- N/A GLP1 instructions:   Last dose of SGLT-2 inhibitors- N/A SGLT-2 instructions:   Blood Thinner Instructions:  N/A Aspirin Instructions: Last Dose:  Activity level: Can go up a flight of stairs and activities of daily living without stopping and without chest pain and/or shortness of breath   Able to exercise without chest pain and/or shortness of breath  Anesthesia review: Hx: HTN,DIA  Patient denies shortness of breath, fever, cough and chest pain at PAT appointment   Patient verbalized understanding of instructions that were given to them at the PAT appointment. Patient was also instructed that they will need to review over the PAT instructions again at home before surgery.

## 2023-02-17 ENCOUNTER — Encounter (HOSPITAL_COMMUNITY): Payer: Self-pay

## 2023-02-26 ENCOUNTER — Encounter (HOSPITAL_COMMUNITY): Payer: Self-pay | Admitting: Surgery

## 2023-02-26 ENCOUNTER — Ambulatory Visit (HOSPITAL_BASED_OUTPATIENT_CLINIC_OR_DEPARTMENT_OTHER): Payer: Medicare (Managed Care) | Admitting: Anesthesiology

## 2023-02-26 ENCOUNTER — Ambulatory Visit (HOSPITAL_COMMUNITY)
Admission: RE | Admit: 2023-02-26 | Discharge: 2023-02-26 | Disposition: A | Discharge status: Home / Self Care | Payer: Medicare (Managed Care) | Source: Ambulatory Visit | Attending: Surgery | Admitting: Surgery

## 2023-02-26 ENCOUNTER — Other Ambulatory Visit: Payer: Self-pay

## 2023-02-26 ENCOUNTER — Ambulatory Visit (HOSPITAL_COMMUNITY): Payer: Self-pay | Admitting: Anesthesiology

## 2023-02-26 ENCOUNTER — Encounter (HOSPITAL_COMMUNITY)
Admission: RE | Disposition: A | Discharge status: Home / Self Care | Payer: Self-pay | Source: Ambulatory Visit | Attending: Surgery

## 2023-02-26 DIAGNOSIS — I10 Essential (primary) hypertension: Secondary | ICD-10-CM | POA: Diagnosis not present

## 2023-02-26 DIAGNOSIS — E119 Type 2 diabetes mellitus without complications: Secondary | ICD-10-CM | POA: Diagnosis not present

## 2023-02-26 DIAGNOSIS — Z6830 Body mass index (BMI) 30.0-30.9, adult: Secondary | ICD-10-CM | POA: Insufficient documentation

## 2023-02-26 DIAGNOSIS — K219 Gastro-esophageal reflux disease without esophagitis: Secondary | ICD-10-CM | POA: Diagnosis not present

## 2023-02-26 DIAGNOSIS — Z7984 Long term (current) use of oral hypoglycemic drugs: Secondary | ICD-10-CM | POA: Insufficient documentation

## 2023-02-26 DIAGNOSIS — E66813 Obesity, class 3: Secondary | ICD-10-CM | POA: Insufficient documentation

## 2023-02-26 DIAGNOSIS — K429 Umbilical hernia without obstruction or gangrene: Secondary | ICD-10-CM | POA: Diagnosis not present

## 2023-02-26 DIAGNOSIS — K5909 Other constipation: Secondary | ICD-10-CM | POA: Diagnosis not present

## 2023-02-26 DIAGNOSIS — K4090 Unilateral inguinal hernia, without obstruction or gangrene, not specified as recurrent: Secondary | ICD-10-CM

## 2023-02-26 DIAGNOSIS — Z79899 Other long term (current) drug therapy: Secondary | ICD-10-CM | POA: Insufficient documentation

## 2023-02-26 HISTORY — PX: INGUINAL HERNIA REPAIR: SHX194

## 2023-02-26 LAB — GLUCOSE, CAPILLARY
Glucose-Capillary: 123 mg/dL — ABNORMAL HIGH (ref 70–99)
Glucose-Capillary: 111 mg/dL — ABNORMAL HIGH (ref 70–99)
Glucose-Capillary: 126 mg/dL — ABNORMAL HIGH (ref 70–99)

## 2023-02-26 SURGERY — REPAIR, HERNIA, INGUINAL, ADULT
Anesthesia: General | Laterality: Left

## 2023-02-26 MED ORDER — ROCURONIUM BROMIDE 10 MG/ML (PF) SYRINGE
PREFILLED_SYRINGE | INTRAVENOUS | Status: AC
  Filled 2023-02-26: qty 10

## 2023-02-26 MED ORDER — ONDANSETRON HCL 4 MG/2ML IJ SOLN
INTRAMUSCULAR | Status: DC | PRN
  Administered 2023-02-26: 4 mg via INTRAVENOUS

## 2023-02-26 MED ORDER — BUPIVACAINE LIPOSOME 1.3 % IJ SUSP
INTRAMUSCULAR | Status: DC | PRN
  Administered 2023-02-26: 10 mL via PERINEURAL

## 2023-02-26 MED ORDER — OXYCODONE HCL 5 MG PO TABS: 5.0000 mg | ORAL_TABLET | Freq: Four times a day (QID) | ORAL | 0 refills | Status: DC | PRN

## 2023-02-26 MED ORDER — LIDOCAINE 2% (20 MG/ML) 5 ML SYRINGE
INTRAMUSCULAR | Status: DC | PRN
  Administered 2023-02-26: 80 mg via INTRAVENOUS

## 2023-02-26 MED ORDER — BUPIVACAINE-EPINEPHRINE 0.25% -1:200000 IJ SOLN
INTRAMUSCULAR | Status: AC
  Filled 2023-02-26: qty 1

## 2023-02-26 MED ORDER — SODIUM CHLORIDE 0.9 % IV SOLN: INTRAVENOUS | Status: DC

## 2023-02-26 MED ORDER — CEFAZOLIN SODIUM-DEXTROSE 2-4 GM/100ML-% IV SOLN
2.0000 g | INTRAVENOUS | Status: AC
  Administered 2023-02-26: 2 g via INTRAVENOUS
  Filled 2023-02-26: qty 100

## 2023-02-26 MED ORDER — BUPIVACAINE-EPINEPHRINE 0.25% -1:200000 IJ SOLN
INTRAMUSCULAR | Status: DC | PRN
  Administered 2023-02-26: 10 mL

## 2023-02-26 MED ORDER — ACETAMINOPHEN 500 MG PO TABS
1000.0000 mg | ORAL_TABLET | ORAL | Status: AC
  Administered 2023-02-26: 1000 mg via ORAL
  Filled 2023-02-26: qty 2

## 2023-02-26 MED ORDER — DEXAMETHASONE SODIUM PHOSPHATE 10 MG/ML IJ SOLN
INTRAMUSCULAR | Status: DC | PRN
  Administered 2023-02-26: 10 mg via INTRAVENOUS

## 2023-02-26 MED ORDER — SUGAMMADEX SODIUM 200 MG/2ML IV SOLN
INTRAVENOUS | Status: DC | PRN
  Administered 2023-02-26: 200 mg via INTRAVENOUS

## 2023-02-26 MED ORDER — MIDAZOLAM HCL 2 MG/2ML IJ SOLN
1.0000 mg | INTRAMUSCULAR | Status: DC
  Administered 2023-02-26: 2 mg via INTRAVENOUS
  Filled 2023-02-26: qty 2

## 2023-02-26 MED ORDER — EPHEDRINE SULFATE-NACL 50-0.9 MG/10ML-% IV SOSY
PREFILLED_SYRINGE | INTRAVENOUS | Status: DC | PRN
  Administered 2023-02-26: 5 mg via INTRAVENOUS
  Administered 2023-02-26: 7.5 mg via INTRAVENOUS

## 2023-02-26 MED ORDER — BUPIVACAINE HCL (PF) 0.25 % IJ SOLN
INTRAMUSCULAR | Status: DC | PRN
  Administered 2023-02-26: 20 mL via PERINEURAL

## 2023-02-26 MED ORDER — LIDOCAINE HCL (PF) 2 % IJ SOLN
INTRAMUSCULAR | Status: AC
  Filled 2023-02-26: qty 5

## 2023-02-26 MED ORDER — FENTANYL CITRATE (PF) 100 MCG/2ML IJ SOLN
INTRAMUSCULAR | Status: DC | PRN
  Administered 2023-02-26: 100 ug via INTRAVENOUS

## 2023-02-26 MED ORDER — PROPOFOL 10 MG/ML IV BOLUS
INTRAVENOUS | Status: DC | PRN
  Administered 2023-02-26: 200 mg via INTRAVENOUS

## 2023-02-26 MED ORDER — CHLORHEXIDINE GLUCONATE 0.12 % MT SOLN
15.0000 mL | Freq: Once | OROMUCOSAL | Status: AC
  Administered 2023-02-26: 15 mL via OROMUCOSAL

## 2023-02-26 MED ORDER — PROPOFOL 10 MG/ML IV BOLUS
INTRAVENOUS | Status: AC
  Filled 2023-02-26: qty 20

## 2023-02-26 MED ORDER — CHLORHEXIDINE GLUCONATE CLOTH 2 % EX PADS: 6.0000 | MEDICATED_PAD | Freq: Once | CUTANEOUS | Status: DC

## 2023-02-26 MED ORDER — FENTANYL CITRATE (PF) 100 MCG/2ML IJ SOLN
INTRAMUSCULAR | Status: AC
  Filled 2023-02-26: qty 2

## 2023-02-26 MED ORDER — ROCURONIUM BROMIDE 10 MG/ML (PF) SYRINGE
PREFILLED_SYRINGE | INTRAVENOUS | Status: DC | PRN
  Administered 2023-02-26: 60 mg via INTRAVENOUS

## 2023-02-26 MED ORDER — KETOROLAC TROMETHAMINE 30 MG/ML IJ SOLN
INTRAMUSCULAR | Status: DC | PRN
  Administered 2023-02-26: 30 mg via INTRAVENOUS

## 2023-02-26 MED ORDER — LACTATED RINGERS IV SOLN
INTRAVENOUS | Status: DC
Start: 2023-02-26 — End: 2023-02-26

## 2023-02-26 MED ORDER — ORAL CARE MOUTH RINSE: 15.0000 mL | Freq: Once | OROMUCOSAL | Status: AC

## 2023-02-26 MED ORDER — ONDANSETRON HCL 4 MG/2ML IJ SOLN
INTRAMUSCULAR | Status: AC
  Filled 2023-02-26: qty 2

## 2023-02-26 MED ORDER — 0.9 % SODIUM CHLORIDE (POUR BTL) OPTIME
TOPICAL | Status: DC | PRN
  Administered 2023-02-26: 1000 mL

## 2023-02-26 MED ORDER — DEXAMETHASONE SODIUM PHOSPHATE 10 MG/ML IJ SOLN
INTRAMUSCULAR | Status: AC
  Filled 2023-02-26: qty 1

## 2023-02-26 MED ORDER — PHENYLEPHRINE 80 MCG/ML (10ML) SYRINGE FOR IV PUSH (FOR BLOOD PRESSURE SUPPORT)
PREFILLED_SYRINGE | INTRAVENOUS | Status: DC | PRN
  Administered 2023-02-26 (×2): 80 ug via INTRAVENOUS

## 2023-02-26 MED ORDER — FENTANYL CITRATE PF 50 MCG/ML IJ SOSY
50.0000 ug | PREFILLED_SYRINGE | INTRAMUSCULAR | Status: DC
  Administered 2023-02-26: 50 ug via INTRAVENOUS
  Filled 2023-02-26: qty 2

## 2023-02-26 SURGICAL SUPPLY — 41 items
BAG COUNTER SPONGE SURGICOUNT (BAG) IMPLANT
BLADE SURG 15 STRL LF DISP TIS (BLADE) ×1 IMPLANT
CHLORAPREP W/TINT 26 (MISCELLANEOUS) ×1 IMPLANT
COVER SURGICAL LIGHT HANDLE (MISCELLANEOUS) ×1 IMPLANT
DISSECTOR ROUND CHERRY 3/8 STR (MISCELLANEOUS) IMPLANT
DRAIN PENROSE 0.5X18 (DRAIN) IMPLANT
DRAPE LAPAROTOMY TRNSV 102X78 (DRAPES) ×1 IMPLANT
DRAPE UTILITY XL STRL (DRAPES) ×1 IMPLANT
DRSG TEGADERM 4X4.75 (GAUZE/BANDAGES/DRESSINGS) IMPLANT
ELECT PENCIL ROCKER SW 15FT (MISCELLANEOUS) ×1 IMPLANT
ELECT REM PT RETURN 15FT ADLT (MISCELLANEOUS) ×1 IMPLANT
GAUZE 4X4 16PLY ~~LOC~~+RFID DBL (SPONGE) ×1 IMPLANT
GAUZE SPONGE 4X4 12PLY STRL (GAUZE/BANDAGES/DRESSINGS) ×1 IMPLANT
GLOVE BIO SURGEON STRL SZ7 (GLOVE) ×1 IMPLANT
GLOVE BIOGEL PI IND STRL 7.5 (GLOVE) ×1 IMPLANT
GOWN STRL REUS W/ TWL LRG LVL3 (GOWN DISPOSABLE) ×1 IMPLANT
KIT BASIN OR (CUSTOM PROCEDURE TRAY) ×1 IMPLANT
KIT TURNOVER KIT A (KITS) IMPLANT
MARKER SKIN DUAL TIP RULER LAB (MISCELLANEOUS) ×1 IMPLANT
MESH PARIETEX PROGRIP LEFT (Mesh General) IMPLANT
NDL HYPO 25X1 1.5 SAFETY (NEEDLE) ×1 IMPLANT
NEEDLE HYPO 25X1 1.5 SAFETY (NEEDLE) ×1
PACK BASIC VI WITH GOWN DISP (CUSTOM PROCEDURE TRAY) ×1 IMPLANT
SPIKE FLUID TRANSFER (MISCELLANEOUS) ×1 IMPLANT
STRIP CLOSURE SKIN 1/2X4 (GAUZE/BANDAGES/DRESSINGS) ×1 IMPLANT
SUT MNCRL AB 4-0 PS2 18 (SUTURE) ×1 IMPLANT
SUT PDS PLUS AB 0 CT-2 (SUTURE) IMPLANT
SUT PROLENE 2 0 SH DA (SUTURE) IMPLANT
SUT SILK 3-0 18XBRD TIE 12 (SUTURE) IMPLANT
SUT VIC AB 2-0 CT2 27 (SUTURE) IMPLANT
SUT VIC AB 2-0 SH 27X BRD (SUTURE) ×1 IMPLANT
SUT VIC AB 3-0 SH 27X BRD (SUTURE) ×1 IMPLANT
SUT VICRYL 0 27 CT2 27 ABS (SUTURE) IMPLANT
SUT VICRYL 0 UR6 27IN ABS (SUTURE) IMPLANT
SYR 20ML LL LF (SYRINGE) ×1 IMPLANT
SYR BULB IRRIG 60ML STRL (SYRINGE) ×1 IMPLANT
TAPE STRIPS DRAPE STRL (GAUZE/BANDAGES/DRESSINGS) IMPLANT
TAPE SURG TRANSPORE 1 IN (GAUZE/BANDAGES/DRESSINGS) IMPLANT
TOWEL OR 17X26 10 PK STRL BLUE (TOWEL DISPOSABLE) ×1 IMPLANT
TOWEL OR NON WOVEN STRL DISP B (DISPOSABLE) ×1 IMPLANT
YANKAUER SUCT BULB TIP 10FT TU (MISCELLANEOUS) ×1 IMPLANT

## 2023-02-26 NOTE — Anesthesia Procedure Notes (Signed)
Anesthesia Regional Block: TAP block   Pre-Anesthetic Checklist: , timeout performed,  Correct Patient, Correct Site, Correct Laterality,  Correct Procedure, Correct Position, site marked,  Risks and benefits discussed,  Surgical consent,  Pre-op evaluation,  At surgeon's request and post-op pain management  Laterality: Left  Prep: chloraprep       Needles:  Injection technique: Single-shot  Needle Type: Echogenic Needle     Needle Length: 9cm  Needle Gauge: 21     Additional Needles:   Procedures:,,,, ultrasound used (permanent image in chart),,    Narrative:  Start time: 02/26/2023 8:10 AM End time: 02/26/2023 8:20 AM Injection made incrementally with aspirations every 5 mL.  Performed by: Personally  Anesthesiologist: Collene Schlichter, MD  Additional Notes: No pain on injection. No increased resistance to injection. Injection made in 5cc increments.  Good needle visualization.  Patient tolerated procedure well.

## 2023-02-26 NOTE — Op Note (Signed)
Left inguinal hernia repair with mesh Procedure Note  Indications: This is a 67 year old male with a history of a laparoscopic right inguinal hernia repair with mesh in 1995 who presents with a 2-year history of an enlarging bulge in his left groin. This has become quite large and extends down towards the scrotum. He has some chronic constipation and uses a laxative on a daily basis. He denies any urinary symptoms. He presents now for evaluation for hernia repair.   Pre-operative Diagnosis: left reducible inguinal hernia Post-operative Diagnosis: same  Surgeon: Wynona Luna   Assistants: none  Anesthesia: General endotracheal anesthesia and TAP block  ASA Class: 2  Procedure Details  The patient was seen again in the Holding Room. The risks, benefits, complications, treatment options, and expected outcomes were discussed with the patient. The possibilities of reaction to medication, pulmonary aspiration, perforation of viscus, bleeding, recurrent infection, the need for additional procedures, and development of a complication requiring transfusion or further operation were discussed with the patient and/or family. The likelihood of success in repairing the hernia and returning the patient to their previous functional status is good.  There was concurrence with the proposed plan, and informed consent was obtained. The site of surgery was properly noted/marked. The patient was taken to the Operating Room, identified as Lance [name], and the procedure verified as left inguinal hernia repair. A Time Out was held and the above information confirmed.  The patient was placed in the supine position and underwent induction of anesthesia. The lower abdomen and groin was prepped with Chloraprep and draped in the standard fashion, and 0.25% Marcaine with epinephrine was used to anesthetize the skin over the mid-portion of the inguinal canal.  The patient has an obvious large bulge in the left groin  extending down to the scrotum.   An oblique incision was made. Dissection was carried down through the subcutaneous tissue with cautery to the external oblique fascia.  We opened the external oblique fascia along the direction of its fibers to the external ring.  The spermatic cord was circumferentially dissected bluntly and retracted with a Penrose drain.   The floor of the inguinal canal was inspected and revealed a large direct hernia defect.  We dissected the direct hernia sac free from the surrounding structures.  We reduced the direct hernia sac and closed the floor of the inguinal canal with 0 PDC.  We skeletonized the spermatic cord and did not see an indirect hernia sac.  We used a left Progrip mesh which was inserted and deployed across the floor of the inguinal canal. The mesh was tucked underneath the external oblique fascia laterally.  The flap of the mesh was closed around the spermatic cord to recreate the internal inguinal ring.  The mesh was secured to the pubic tubercle with 0 Vicryl.  Additional stay sutures were used to close the mesh flap and to attach the inferior edge of the mesh to the shelving edge.  The external oblique fascia was reapproximated with 2-0 Vicryl.  3-0 Vicryl was used to close the subcutaneous tissues and 4-0 Monocryl was used to close the skin in subcuticular fashion.  Benzoin and steri-strips were used to seal the incision.  A clean dressing was applied.  The patient was then extubated and brought to the recovery room in stable condition.  All sponge, instrument, and needle counts were correct prior to closure and at the conclusion of the case.   Estimated Blood Loss: Minimal  Complications: None; patient tolerated the procedure well.         Disposition: PACU - hemodynamically stable.         Condition: stable  Wilmon Arms. Corliss Skains, MD, [facility] Surgery  General Surgery   02/26/2023 11:06 AM

## 2023-02-26 NOTE — Anesthesia Procedure Notes (Signed)
Procedure Name: Intubation Date/Time: 02/26/2023 9:47 AM  Performed by: Randa Evens, CRNAPre-anesthesia Checklist: Patient identified, Emergency Drugs available, Suction available and Patient being monitored Patient Re-evaluated:Patient Re-evaluated prior to induction Oxygen Delivery Method: Circle System Utilized Preoxygenation: Pre-oxygenation with 100% oxygen Induction Type: IV induction Ventilation: Mask ventilation without difficulty and Oral airway inserted - appropriate to patient size Laryngoscope Size: Mac and 4 Grade View: Grade II Tube type: Oral Tube size: 7.5 mm Number of attempts: 1 Airway Equipment and Method: Stylet and Oral airway Placement Confirmation: ETT inserted through vocal cords under direct vision, positive ETCO2 and breath sounds checked- equal and bilateral Secured at: 22 cm Tube secured with: Tape Dental Injury: Teeth and Oropharynx as per pre-operative assessment

## 2023-02-26 NOTE — Transfer of Care (Signed)
Immediate Anesthesia Transfer of Care Note  Patient: Lance [name]  Procedure(s) Performed: OPEN LEFT INGUINAL HERNIA REPAIR WITH MESH (Left)  Patient Location: PACU  Anesthesia Type:General  Level of Consciousness: drowsy  Airway & Oxygen Therapy: Patient Spontanous Breathing and Patient connected to face mask oxygen  Post-op Assessment: Report given to RN  Post vital signs: Reviewed and stable  Last Vitals:  Vitals Value Taken Time  BP 148/94 02/26/23 1110  Temp    Pulse 67 02/26/23 1111  Resp 15 02/26/23 1111  SpO2 98 % 02/26/23 1111  Vitals shown include unfiled device data.  Last Pain:  Vitals:   02/26/23 0733  PainSc: 0-No pain         Complications: No notable events documented.

## 2023-02-26 NOTE — Interval H&P Note (Signed)
History and Physical Interval Note:  02/26/2023 9:15 AM  Lance [name]  has presented today for surgery, with the diagnosis of LEFT INGUINAL HERNIA.  The various methods of treatment have been discussed with the patient and family. After consideration of risks, benefits and other options for treatment, the patient has consented to  Procedure(s): OPEN LEFT INGUINAL HERNIA REPAIR WITH MESH (Left) as a surgical intervention.  The patient's history has been reviewed, patient examined, no change in status, stable for surgery.  I have reviewed the patient's chart and labs.  Questions were answered to the patient's satisfaction.     Wynona Luna

## 2023-02-26 NOTE — Anesthesia Postprocedure Evaluation (Signed)
Anesthesia Post Note  Patient: Lance [name]  Procedure(s) Performed: OPEN LEFT INGUINAL HERNIA REPAIR WITH MESH (Left)     Patient location during evaluation: PACU Anesthesia Type: General Level of consciousness: awake and alert Pain management: pain level controlled Vital Signs Assessment: post-procedure vital signs reviewed and stable Respiratory status: spontaneous breathing, nonlabored ventilation and respiratory function stable Cardiovascular status: blood pressure returned to baseline and stable Postop Assessment: no apparent nausea or vomiting Anesthetic complications: no   No notable events documented.  Last Vitals:  Vitals:   02/26/23 1245 02/26/23 1300  BP: (!) 144/88 (!) 138/94  Pulse: 64 60  Resp: 20   Temp: (!) 36.4 C   SpO2: 96% 100%    Last Pain:  Vitals:   02/26/23 1245  PainSc: 0-No pain                 Collene Schlichter

## 2023-02-26 NOTE — Anesthesia Preprocedure Evaluation (Addendum)
Anesthesia Evaluation  Patient identified by MRN, date of birth, ID band Patient awake    Reviewed: Allergy & Precautions, NPO status , Patient's Chart, lab work & pertinent test results, reviewed documented beta blocker date and time   Airway Mallampati: III  TM Distance: >3 FB Neck ROM: Full    Dental  (+) Teeth Intact, Dental Advisory Given   Pulmonary asthma , sleep apnea    Pulmonary exam normal breath sounds clear to auscultation       Cardiovascular hypertension, Pt. on medications and Pt. on home beta blockers Normal cardiovascular exam Rhythm:Regular Rate:Normal     Neuro/Psych negative neurological ROS  negative psych ROS   GI/Hepatic Neg liver ROS,GERD  Medicated,,  Endo/Other  diabetes, Type 2, Oral Hypoglycemic Agents  Class 3 obesity  Renal/GU negative Renal ROS     Musculoskeletal  (+) Arthritis ,    Abdominal   Peds  Hematology negative hematology ROS (+)   Anesthesia Other Findings Day of surgery medications reviewed with the patient.  Reproductive/Obstetrics                             Anesthesia Physical Anesthesia Plan  ASA: 2  Anesthesia Plan: General   Post-op Pain Management: Tylenol PO (pre-op)*, Toradol IV (intra-op)* and Regional block*   Induction: Intravenous  PONV Risk Score and Plan: 3 and Midazolam, Dexamethasone and Ondansetron  Airway Management Planned: Oral ETT  Additional Equipment:   Intra-op Plan:   Post-operative Plan: Extubation in OR  Informed Consent: I have reviewed the patients History and Physical, chart, labs and discussed the procedure including the risks, benefits and alternatives for the proposed anesthesia with the patient or authorized representative who has indicated his/her understanding and acceptance.     Dental advisory given  Plan Discussed with: CRNA  Anesthesia Plan Comments:        Anesthesia Quick  Evaluation

## 2023-02-26 NOTE — Discharge Instructions (Signed)

## 2023-02-27 ENCOUNTER — Encounter (HOSPITAL_COMMUNITY): Payer: Self-pay | Admitting: Surgery

## 2023-04-09 ENCOUNTER — Telehealth: Payer: Self-pay

## 2023-04-09 DIAGNOSIS — Z860100 Personal history of colon polyps, unspecified: Secondary | ICD-10-CM

## 2023-04-09 NOTE — Telephone Encounter (Signed)
Copied from CRM [id number]. Topic: General - Other >> Apr 09, 2023  4:00 PM Fredrich Romans wrote:  Reason for CRM: patient would like to have an order sent out to have a colonoscopy done

## 2023-04-10 NOTE — Addendum Note (Signed)
Addended by: Corwin Levins on: 04/10/2023 09:49 AM   Modules accepted: Orders

## 2023-04-10 NOTE — Telephone Encounter (Signed)
Ok this is done 

## 2023-05-11 ENCOUNTER — Encounter: Payer: Self-pay | Admitting: Internal Medicine

## 2023-06-05 ENCOUNTER — Ambulatory Visit: Payer: Medicare (Managed Care)

## 2023-06-05 VITALS — Ht 70.5 in | Wt 222.0 lb

## 2023-06-05 DIAGNOSIS — Z860100 Personal history of colon polyps, unspecified: Secondary | ICD-10-CM

## 2023-06-05 MED ORDER — SUFLAVE 178.7 G PO SOLR: 1.0000 | Freq: Once | ORAL | 0 refills | Status: AC

## 2023-06-05 NOTE — Progress Notes (Signed)
 No egg or soy allergy known to patient  No issues known to pt with past sedation with any surgeries or procedures Patient denies ever being told they had issues or difficulty with intubation  No FH of Malignant Hyperthermia Pt is not on diet pills Pt is not on  home 02  Pt is not on blood thinners  Pt has mild issues with constipation and takes senkot, was instructed to continue this. No A fib or A flutter Have any cardiac testing pending--No Pt can ambulate , sometimes uses a cane Pt denies use of chewing tobacco Discussed diabetic I weight loss medication holds Discussed NSAID holds Checked BMI Pt instructed to use [employer] or GoodRx for a price reduction on prep  Patient's chart reviewed by Cathlyn Parsons CNRA prior to previsit and patient appropriate for the [facility].  Pre visit completed and red dot placed by patient's name on their procedure day (on provider's schedule).

## 2023-06-23 ENCOUNTER — Encounter: Payer: Self-pay | Admitting: Internal Medicine

## 2023-06-23 ENCOUNTER — Telehealth: Payer: Self-pay | Admitting: Internal Medicine

## 2023-06-23 NOTE — Telephone Encounter (Signed)
 Inbound call from patient stating he has questions regarding his blood pressure medication for 4/4 colonoscopy. Requesting a call back.  Please advise, thank you.

## 2023-06-23 NOTE — Telephone Encounter (Signed)
 RN returned call to patient regarding blood pressure medications. Patient stated he has had a change in his blood pressure medications and wanted to communicate this information with [place]. RN updated patient medications accordingly.   RN reminded patient to take the medications prior to 11:30 am the day of his procedure and reminded him that nothing else should be consumed orally after 11:30 am. RN also reminded patient not to take his Metformin dose the morning of the procedure.   Patient stated understanding.

## 2023-06-26 ENCOUNTER — Ambulatory Visit (AMBULATORY_SURGERY_CENTER): Payer: Medicare (Managed Care) | Admitting: Internal Medicine

## 2023-06-26 ENCOUNTER — Encounter: Payer: Self-pay | Admitting: Internal Medicine

## 2023-06-26 VITALS — BP 117/68 | HR 64 | Temp 97.9°F | Resp 12 | Ht 70.5 in | Wt 222.0 lb

## 2023-06-26 DIAGNOSIS — K648 Other hemorrhoids: Secondary | ICD-10-CM

## 2023-06-26 DIAGNOSIS — K5730 Diverticulosis of large intestine without perforation or abscess without bleeding: Secondary | ICD-10-CM

## 2023-06-26 DIAGNOSIS — Z1211 Encounter for screening for malignant neoplasm of colon: Secondary | ICD-10-CM | POA: Diagnosis not present

## 2023-06-26 DIAGNOSIS — D123 Benign neoplasm of transverse colon: Secondary | ICD-10-CM

## 2023-06-26 DIAGNOSIS — K635 Polyp of colon: Secondary | ICD-10-CM

## 2023-06-26 DIAGNOSIS — D124 Benign neoplasm of descending colon: Secondary | ICD-10-CM

## 2023-06-26 DIAGNOSIS — D125 Benign neoplasm of sigmoid colon: Secondary | ICD-10-CM | POA: Diagnosis not present

## 2023-06-26 DIAGNOSIS — Z860100 Personal history of colon polyps, unspecified: Secondary | ICD-10-CM

## 2023-06-26 DIAGNOSIS — K6389 Other specified diseases of intestine: Secondary | ICD-10-CM

## 2023-06-26 MED ORDER — SODIUM CHLORIDE 0.9 % IV SOLN: 500.0000 mL | Freq: Once | INTRAVENOUS | Status: DC

## 2023-06-26 NOTE — Progress Notes (Signed)
 Called to room to assist during endoscopic procedure.  Patient ID and intended procedure confirmed with present staff. Received instructions for my participation in the procedure from the performing physician.

## 2023-06-26 NOTE — Progress Notes (Signed)
 Pt's states no medical or surgical changes since previsit or office visit.

## 2023-06-26 NOTE — Progress Notes (Signed)
 GASTROENTEROLOGY PROCEDURE H&P NOTE   Primary Care Physician: Sharmon Revere, MD    Reason for Procedure:  History of colon polyps  Plan:    Colonoscopy  Patient is appropriate for endoscopic procedure(s) in the ambulatory ([facility]) setting.  The nature of the procedure, as well as the risks, benefits, and alternatives were carefully and thoroughly reviewed with the patient. Ample time for discussion and questions allowed. The patient understood, was satisfied, and agreed to proceed.     HPI: Lance [name] is a 68 y.o. male who presents for surveillance colonoscopy.  Medical history as below.  Tolerated the prep.  No recent chest pain or shortness of breath.  No abdominal pain today.  Past Medical History:  Diagnosis Date   ALLERGIC RHINITIS    Allergy    Constipation    uses Senna daily    Diabetes mellitus without complication (HCC)    GERD (gastroesophageal reflux disease)    HYPERLIPIDEMIA    Hypertension    Impaired glucose tolerance    OSTEOARTHRITIS, HANDS, BILATERAL     Past Surgical History:  Procedure Laterality Date   CATARACT EXTRACTION, BILATERAL     COLONOSCOPY     CYST EXCISION     neck and leg,drained and packed   EYE SURGERY Right    lazy eye surgery 2024   HYDROCELE EXCISION Left 10/16/2020   Procedure: HYDROCELECTOMY ADULT;  Surgeon: Riki Altes, MD;  Location: [facility]S;  Service: Urology;  Laterality: Left;   INGUINAL HERNIA REPAIR  1995   right   INGUINAL HERNIA REPAIR Left 02/26/2023   Procedure: OPEN LEFT INGUINAL HERNIA REPAIR WITH MESH;  Surgeon: Manus Rudd, MD;  Location: [facility];  Service: General;  Laterality: Left;   POLYPECTOMY      Prior to Admission medications   Medication Sig Start Date End Date Taking? Authorizing Provider  amLODipine (NORVASC) 5 MG tablet Take 5 mg by mouth at bedtime. 04/09/22  Yes [provider]  atorvastatin (LIPITOR) 40 MG tablet Take 1 tablet by mouth once daily 03/05/21  Yes  Corwin Levins, MD  cetirizine (ZYRTEC) 10 MG tablet Take 1 tablet by mouth once daily 06/16/20  Yes Corwin Levins, MD  Cholecalciferol (VITAMIN D3) 10 MCG (400 UNIT) tablet Take 400 Units by mouth in the morning.   Yes [provider]  fluticasone (FLONASE) 50 MCG/ACT nasal spray Place 2 sprays into the nose daily. 08/17/21  Yes [provider]  meclizine (ANTIVERT) 25 MG tablet Take 25 mg by mouth 3 (three) times daily as needed for dizziness.   Yes [provider]  metoprolol tartrate (LOPRESSOR) 100 MG tablet Take 100 mg by mouth 2 (two) times daily.   Yes [provider]  Omega-3 Fatty Acids (FISH OIL PO) Take 2,400 mg by mouth in the morning and at bedtime.   Yes [provider]  omeprazole (PRILOSEC) 40 MG capsule TAKE 1 CAPSULE BY MOUTH ONCE DAILY . APPOINTMENT REQUIRED FOR FUTURE REFILLS 03/09/22  Yes Corwin Levins, MD  OVER THE COUNTER MEDICATION Take 2 capsules by mouth daily. Glucose support   Yes [provider]  senna (SENOKOT) 8.6 MG TABS tablet Take 2 tablets by mouth daily as needed for mild constipation.   Yes [provider]  valACYclovir (VALTREX) 1000 MG tablet Take 1,000 mg by mouth daily. 04/04/23  Yes [provider]  losartan-hydrochlorothiazide (HYZAAR) 100-25 MG tablet Take 1 tablet by mouth every morning. Patient not taking: Reported on  06/05/2023 04/09/22   [provider]  metFORMIN (GLUCOPHAGE) 500 MG tablet Take 500 mg by mouth daily. Patient not taking: Reported on 06/05/2023    [provider]  OVER THE COUNTER MEDICATION Take 2 capsules by mouth daily. Moss    [provider]  zolpidem (AMBIEN) 10 MG tablet TAKE 1 TABLET BY MOUTH AT BEDTIME AS NEEDED FOR SLEEP 01/04/21   Corwin Levins, MD    Current Outpatient Medications  Medication Sig Dispense Refill   amLODipine (NORVASC) 5 MG tablet Take 5 mg by mouth at bedtime.     atorvastatin (LIPITOR) 40 MG tablet Take 1  tablet by mouth once daily 90 tablet 3   cetirizine (ZYRTEC) 10 MG tablet Take 1 tablet by mouth once daily 90 tablet 3   Cholecalciferol (VITAMIN D3) 10 MCG (400 UNIT) tablet Take 400 Units by mouth in the morning.     fluticasone (FLONASE) 50 MCG/ACT nasal spray Place 2 sprays into the nose daily.     meclizine (ANTIVERT) 25 MG tablet Take 25 mg by mouth 3 (three) times daily as needed for dizziness.     metoprolol tartrate (LOPRESSOR) 100 MG tablet Take 100 mg by mouth 2 (two) times daily.     Omega-3 Fatty Acids (FISH OIL PO) Take 2,400 mg by mouth in the morning and at bedtime.     omeprazole (PRILOSEC) 40 MG capsule TAKE 1 CAPSULE BY MOUTH ONCE DAILY . APPOINTMENT REQUIRED FOR FUTURE REFILLS 30 capsule 0   OVER THE COUNTER MEDICATION Take 2 capsules by mouth daily. Glucose support     senna (SENOKOT) 8.6 MG TABS tablet Take 2 tablets by mouth daily as needed for mild constipation.     valACYclovir (VALTREX) 1000 MG tablet Take 1,000 mg by mouth daily.     losartan-hydrochlorothiazide (HYZAAR) 100-25 MG tablet Take 1 tablet by mouth every morning. (Patient not taking: Reported on 06/05/2023)     metFORMIN (GLUCOPHAGE) 500 MG tablet Take 500 mg by mouth daily. (Patient not taking: Reported on 06/05/2023)     OVER THE COUNTER MEDICATION Take 2 capsules by mouth daily. Moss     zolpidem (AMBIEN) 10 MG tablet TAKE 1 TABLET BY MOUTH AT BEDTIME AS NEEDED FOR SLEEP 90 tablet 1   Current Facility-Administered Medications  Medication Dose Route Frequency Provider Last Rate Last Admin   0.9 %  sodium chloride infusion  500 mL Intravenous Once Talene Glastetter, Carie Caddy, MD        Allergies as of 06/26/2023   (No Known Allergies)    Family History  Problem Relation Age of Onset   Hypertension Other    Diabetes Other    Hypothyroidism Other    Aneurysm Other    Prostate cancer Father    Cancer Sister        unsure of type    Lung cancer Brother    Lung cancer Sister    Kidney disease Mother    Colon  cancer Neg Hx    Stomach cancer Neg Hx    Colon polyps Neg Hx    Esophageal cancer Neg Hx    Rectal cancer Neg Hx     Social History   Socioeconomic History   Marital status: Married    Spouse name: Not on file   Number of children: Not on file   Years of education: Not on file   Highest education level: Not on file  Occupational History   Not on file  Tobacco Use  Smoking status: Never   Smokeless tobacco: Never  Vaping Use   Vaping status: Never Used  Substance and Sexual Activity   Alcohol use: Not Currently   Drug use: Not Currently   Sexual activity: Yes  Other Topics Concern   Not on file  Social History Narrative   Not on file   Social Drivers of Health   Financial Resource Strain: Not on file  Food Insecurity: Not on file  Transportation Needs: Not on file  Physical Activity: Not on file  Stress: Not on file  Social Connections: Unknown (08/06/2021)   Received from [facility]   Social Network    Social Network: Not on file  Intimate Partner Violence: Unknown (06/28/2021)   Received from [facility], [facility]   HITS    Physically Hurt: Not on file    Insult or Talk Down To: Not on file    Threaten Physical Harm: Not on file    Scream or Curse: Not on file    Physical Exam: Vital signs in last 24 hours: @BP  (!) 156/77   Pulse (!) 56   Temp 97.9 F (36.6 C) (Temporal)   Resp 14   Ht 5' 10.5" (1.791 m)   Wt 222 lb (100.7 kg)   SpO2 97%   BMI 31.40 kg/m  GEN: NAD EYE: Sclerae anicteric ENT: MMM CV: Non-tachycardic Pulm: CTA b/l GI: Soft, NT/ND NEURO:  Alert & Oriented x 3   Erick Blinks, MD [facility]  06/26/2023 2:49 PM

## 2023-06-26 NOTE — Progress Notes (Signed)
 A/O x 3, gd SR's, VSS, report to RN

## 2023-06-26 NOTE — Patient Instructions (Signed)
 Handouts provided on polyps, diverticulosis and hemorrhoids.  Resume previous diet.  Continue present medications.  Repeat colonoscopy in 3 years for surveillance.   YOU HAD AN ENDOSCOPIC PROCEDURE TODAY AT THE [facility]:   Refer to the procedure report that was given to you for any specific questions about what was found during the examination.  If the procedure report does not answer your questions, please call your gastroenterologist to clarify.  If you requested that your care partner not be given the details of your procedure findings, then the procedure report has been included in a sealed envelope for you to review at your convenience later.  YOU SHOULD EXPECT: Some feelings of bloating in the abdomen. Passage of more gas than usual.  Walking can help get rid of the air that was put into your GI tract during the procedure and reduce the bloating. If you had a lower endoscopy (such as a colonoscopy or flexible sigmoidoscopy) you may notice spotting of blood in your stool or on the toilet paper. If you underwent a bowel prep for your procedure, you may not have a normal bowel movement for a few days.  Please Note:  You might notice some irritation and congestion in your nose or some drainage.  This is from the oxygen used during your procedure.  There is no need for concern and it should clear up in a day or so.  SYMPTOMS TO REPORT IMMEDIATELY:  Following lower endoscopy (colonoscopy or flexible sigmoidoscopy):  Excessive amounts of blood in the stool  Significant tenderness or worsening of abdominal pains  Swelling of the abdomen that is new, acute  Fever of 100F or higher  For urgent or emergent issues, a gastroenterologist can be reached at any hour by calling [phone]. Do not use MyChart messaging for urgent concerns.    DIET:  We do recommend a small meal at first, but then you may proceed to your regular diet.  Drink plenty of fluids but you should avoid  alcoholic beverages for 24 hours.  ACTIVITY:  You should plan to take it easy for the rest of today and you should NOT DRIVE or use heavy machinery until tomorrow (because of the sedation medicines used during the test).    FOLLOW UP: Our staff will call the number listed on your records the next business day following your procedure.  We will call around 7:15- 8:00 am to check on you and address any questions or concerns that you may have regarding the information given to you following your procedure. If we do not reach you, we will leave a message.     If any biopsies were taken you will be contacted by phone or by letter within the next 1-3 weeks.  Please call us at [phone] if you have not heard about the biopsies in 3 weeks.    SIGNATURES/CONFIDENTIALITY: You and/or your care partner have signed paperwork which will be entered into your electronic medical record.  These signatures attest to the fact that that the information above on your After Visit Summary has been reviewed and is understood.  Full responsibility of the confidentiality of this discharge information lies with you and/or your care-partner.

## 2023-06-26 NOTE — Op Note (Addendum)
 [facility] Patient Name: Lance [name] Procedure Date: 06/26/2023 1:44 PM MRN: [id number] Endoscopist: Beverley Fiedler , MD, [id number] Age: 68 Referring MD:  Date of Birth: December 10, 1955 Gender: Male Account #: [id number] Procedure:                Colonoscopy Indications:              High risk colon cancer surveillance: Personal                            history of multiple adenomas, Last colonoscopy:                            December 2019 (TA x 2), Oct 2014 (TA x 1), Sept                            2013 (TA x 10) Medicines:                Monitored Anesthesia Care Procedure:                Pre-Anesthesia Assessment:                           - Prior to the procedure, a History and Physical                            was performed, and patient medications and                            allergies were reviewed. The patient's tolerance of                            previous anesthesia was also reviewed. The risks                            and benefits of the procedure and the sedation                            options and risks were discussed with the patient.                            All questions were answered, and informed consent                            was obtained. Prior Anticoagulants: The patient has                            taken no anticoagulant or antiplatelet agents. ASA                            Grade Assessment: II - A patient with mild systemic                            disease. After reviewing the risks and benefits,  the patient was deemed in satisfactory condition to                            undergo the procedure.                           After obtaining informed consent, the colonoscope                            was passed under direct vision. Throughout the                            procedure, the patient's blood pressure, pulse, and                            oxygen saturations were monitored continuously. The                             Olympus Scope [id number] was introduced through the                            anus and advanced to the cecum, identified by                            appendiceal orifice and ileocecal valve. The                            colonoscopy was performed without difficulty. The                            patient tolerated the procedure well. The quality                            of the bowel preparation was good. The ileocecal                            valve, appendiceal orifice, and rectum were                            photographed. Scope In: 2:52:50 PM Scope Out: 3:14:22 PM Scope Withdrawal Time: 0 hours 20 minutes 14 seconds  Total Procedure Duration: 0 hours 21 minutes 32 seconds  Findings:                 The digital rectal exam was normal.                           Five sessile polyps were found in the transverse                            colon. The polyps were 4 to 5 mm in size. These                            polyps were removed with a cold snare. Resection  and retrieval were complete.                           Two sessile polyps were found in the descending                            colon. The polyps were 4 to 5 mm in size. These                            polyps were removed with a cold snare. Resection                            and retrieval were complete.                           Two sessile polyps were found in the sigmoid colon.                            The polyps were 3 to 4 mm in size. These polyps                            were removed with a cold snare. Resection and                            retrieval were complete.                           A diffuse area of moderate melanosis was found in                            the entire colon.                           Multiple medium-mouthed and small-mouthed                            diverticula were found in the sigmoid colon,                            descending  colon, ascending colon and cecum.                           Internal hemorrhoids were found during                            retroflexion. The hemorrhoids were small. Complications:            No immediate complications. Estimated Blood Loss:     Estimated blood loss was minimal. Impression:               - Five 4 to 5 mm polyps in the transverse colon,                            removed with a cold snare. Resected and retrieved.                           -  Two 4 to 5 mm polyps in the descending colon,                            removed with a cold snare. Resected and retrieved.                           - Two 3 to 4 mm polyps in the sigmoid colon,                            removed with a cold snare. Resected and retrieved.                           - Melanosis in the colon.                           - Moderate diverticulosis in the sigmoid colon, in                            the descending colon, in the ascending colon and in                            the cecum.                           - Small internal hemorrhoids. Recommendation:           - Patient has a contact number available for                            emergencies. The signs and symptoms of potential                            delayed complications were discussed with the                            patient. Return to normal activities tomorrow.                            Written discharge instructions were provided to the                            patient.                           - Continue present medications.                           - Resume previous diet.                           - Continue present medications.                           - Repeat colonoscopy in 3 years for surveillance. Beverley Fiedler, MD 06/26/2023 3:21:25 PM This report has been signed electronically.

## 2023-06-29 ENCOUNTER — Telehealth: Payer: Self-pay | Admitting: *Deleted

## 2023-06-29 NOTE — Telephone Encounter (Signed)
 Attempted post procedure follow up call.  No answer - LVM.

## 2023-07-01 ENCOUNTER — Encounter: Payer: Self-pay | Admitting: Internal Medicine

## 2023-07-01 LAB — SURGICAL PATHOLOGY

## 2023-08-27 IMAGING — US US ABDOMINAL AORTA SCREENING AAA
1 series · 14 of 25 positions shown · non-contrast
Comparison: None.

CLINICAL DATA: Patient between 65-75 years of age with family
history of AAA.

EXAM:
US ABDOMINAL AORTA MEDICARE SCREENING
TECHNIQUE: Ultrasound examination of the abdominal aorta was performed as a
screening evaluation for abdominal aortic aneurysm.

[Series 1: us abdominal aorta screening aaa · 0.28mm/px · 14 of 27 slices shown]
[im 1/27]
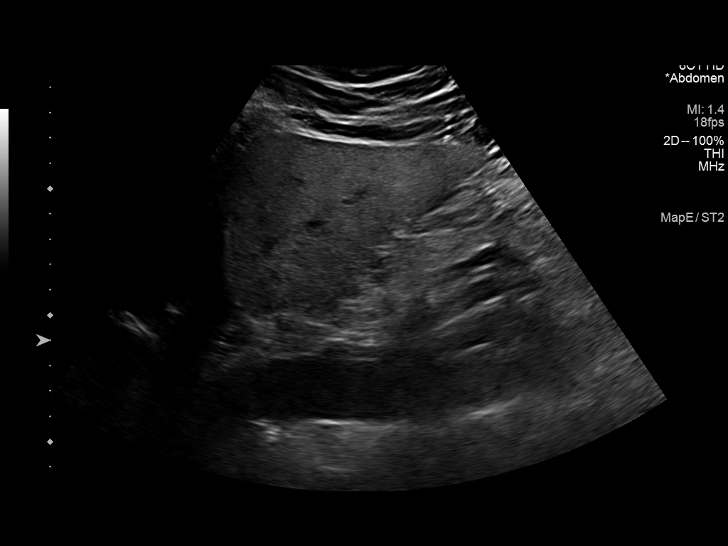
[im 3/27]
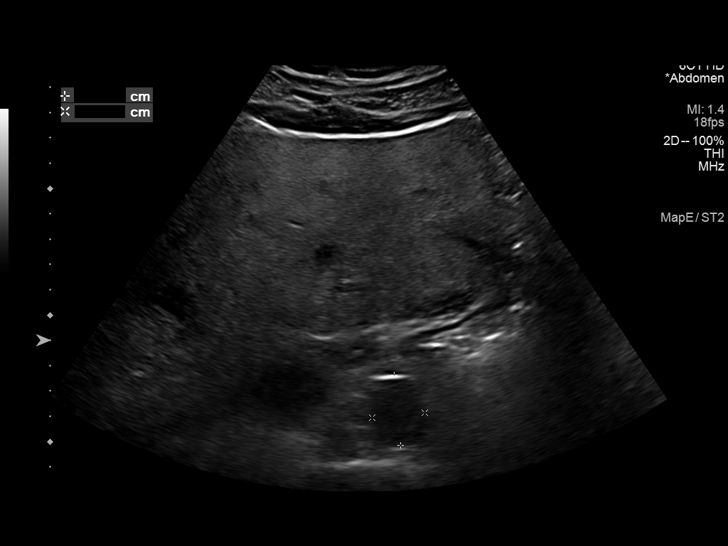
[im 5/27]
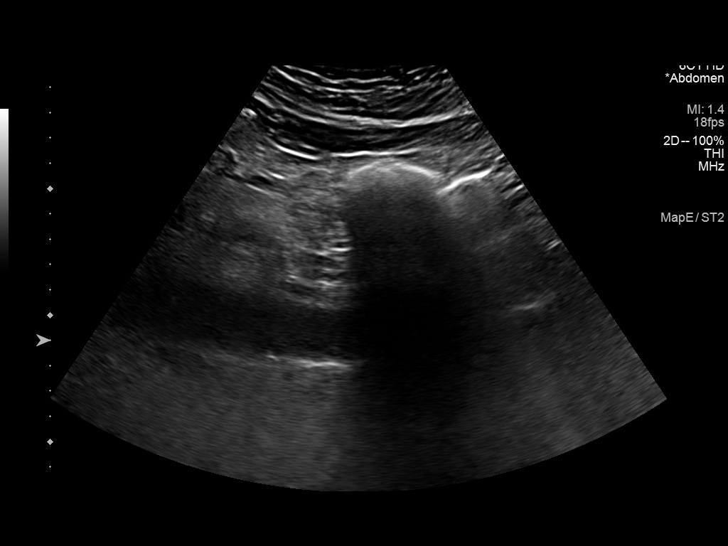
[im 7/27]
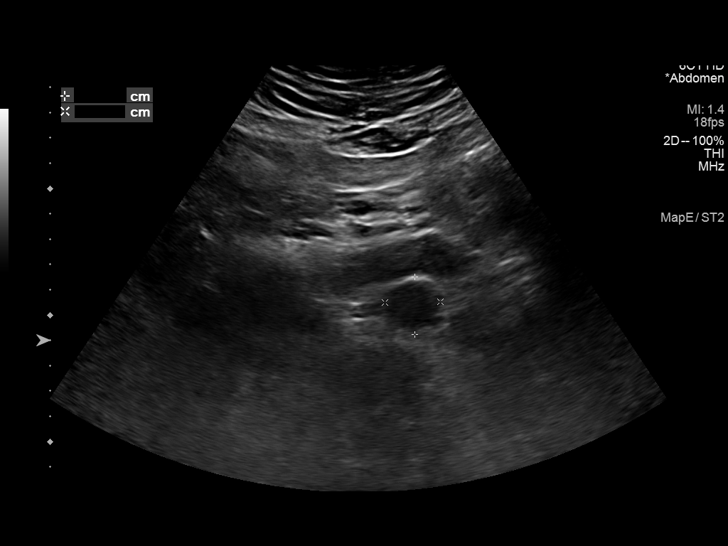
[im 9/27]
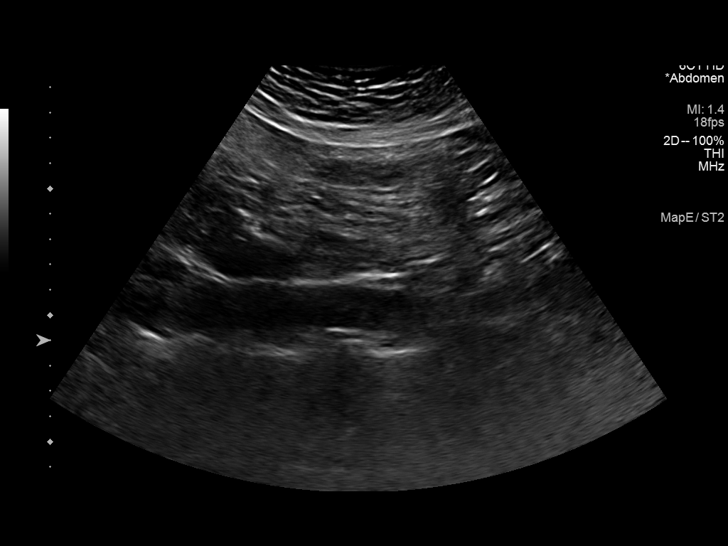
[im 10/27]
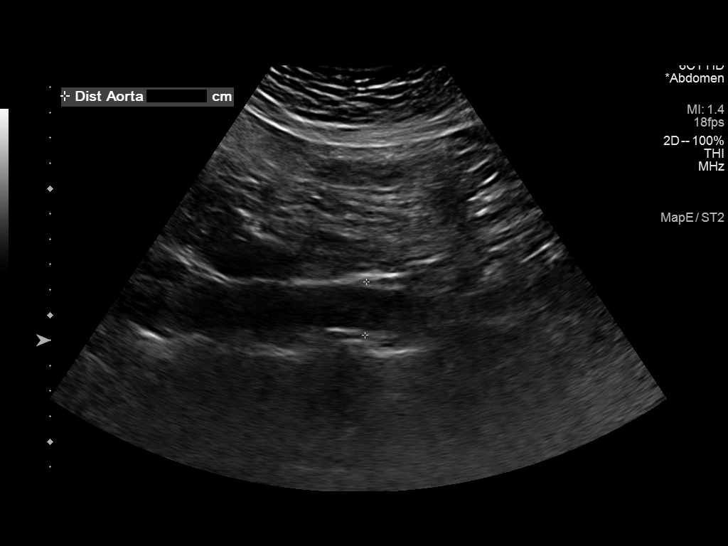
[im 12/27]
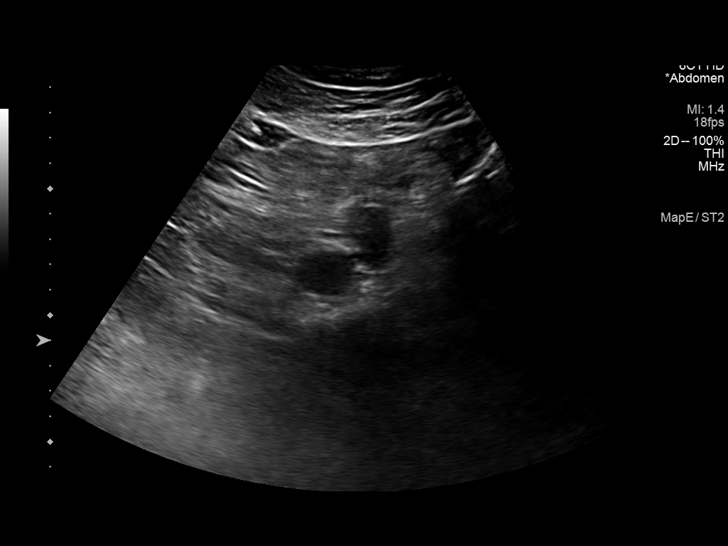
[im 15/27]
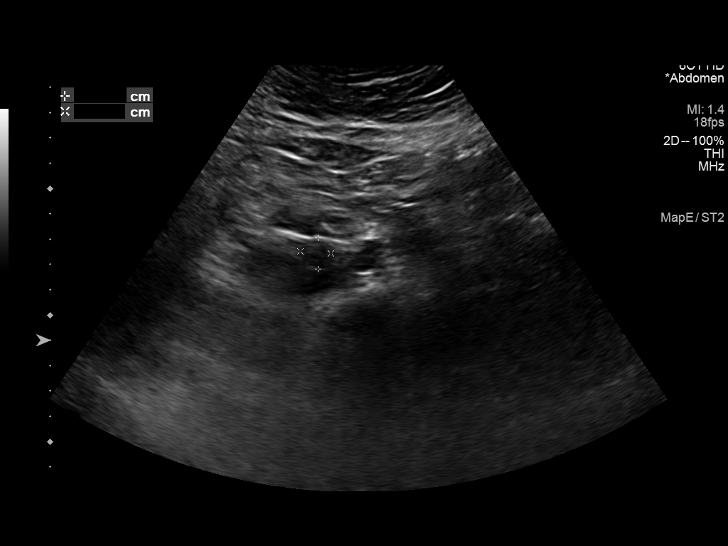
[im 17/27]
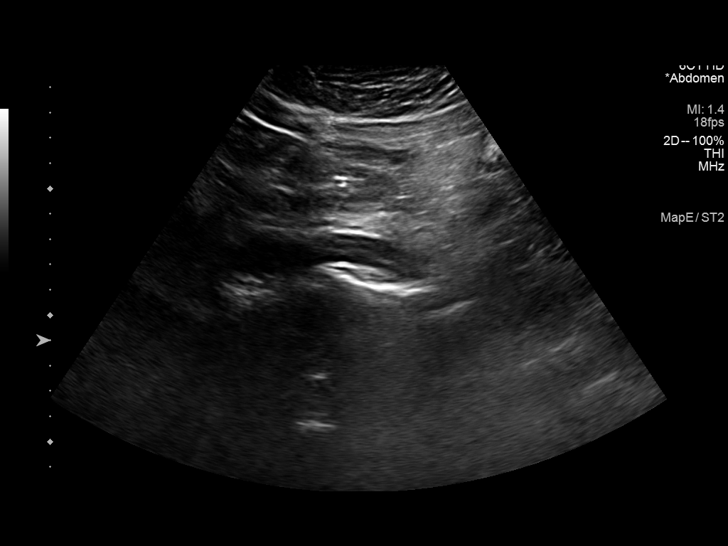
[im 18/27]
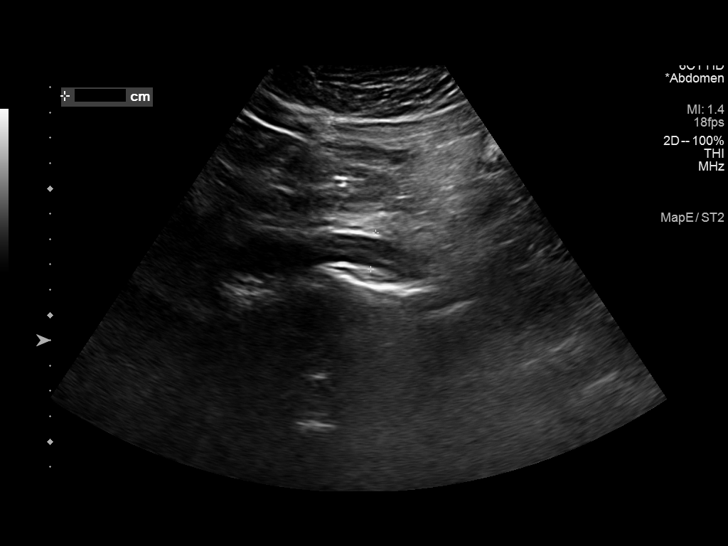
[im 20/27]
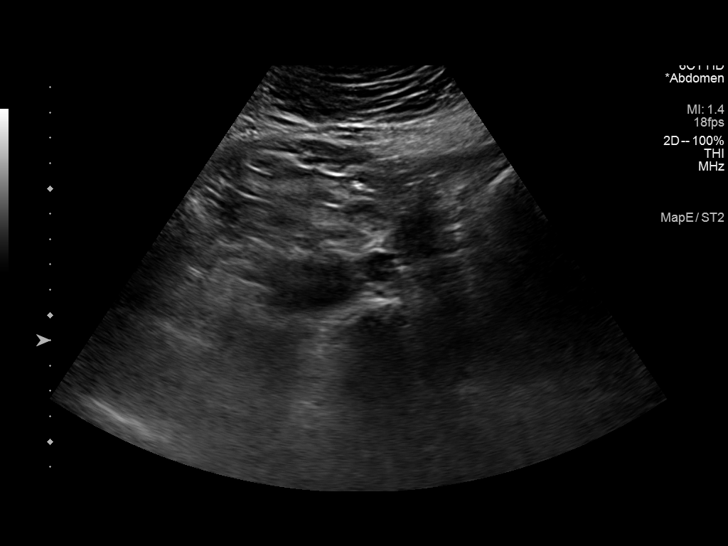
[im 22/27]
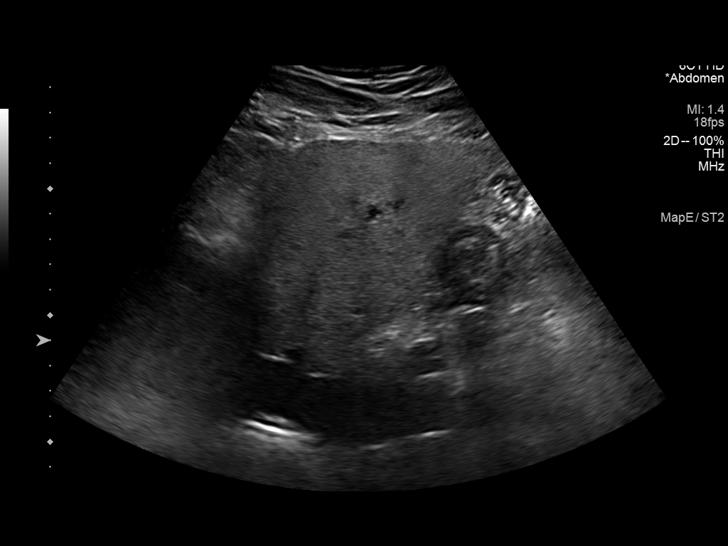
[im 24/27]
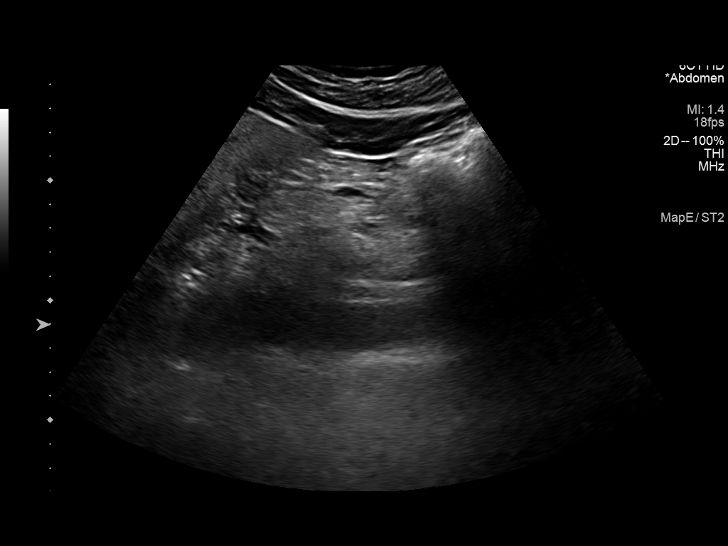
[im 27/27]
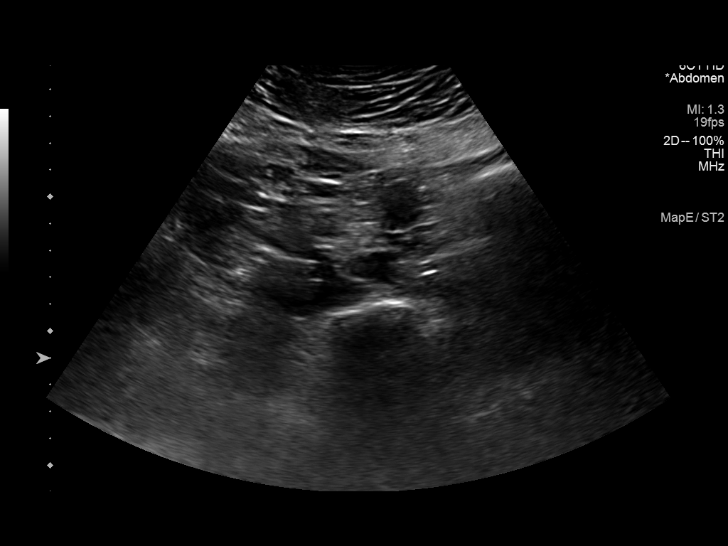

[14 of 25 positions shown; findings below may reference images not displayed]

FINDINGS: Abdominal aortic measurements as follows:

Proximal:  2.7 cm

Mid:  2.3 cm

Distal:  2.3 cm
IMPRESSION: No evidence of abdominal aortic aneurysm.
